# Patient Record
Sex: Male | Born: 1990 | Race: White | Hispanic: Yes | Marital: Married | Smoking: Former smoker
Health system: Southern US, Community
[De-identification: ages and names within clinical notes are randomized; demographics above are authoritative.]

---
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
## 2010-06-26 ENCOUNTER — Emergency Department: Payer: Self-pay | Admitting: Emergency Medicine

## 2015-10-11 DIAGNOSIS — M531 Cervicobrachial syndrome: Secondary | ICD-10-CM | POA: Diagnosis not present

## 2015-10-11 DIAGNOSIS — M9903 Segmental and somatic dysfunction of lumbar region: Secondary | ICD-10-CM | POA: Diagnosis not present

## 2015-10-27 DIAGNOSIS — L441 Lichen nitidus: Secondary | ICD-10-CM | POA: Diagnosis not present

## 2015-10-27 DIAGNOSIS — L309 Dermatitis, unspecified: Secondary | ICD-10-CM | POA: Diagnosis not present

## 2016-08-14 DIAGNOSIS — B86 Scabies: Secondary | ICD-10-CM | POA: Diagnosis not present

## 2016-08-15 ENCOUNTER — Ambulatory Visit (INDEPENDENT_AMBULATORY_CARE_PROVIDER_SITE_OTHER): Payer: BLUE CROSS/BLUE SHIELD | Admitting: Physician Assistant

## 2016-08-15 ENCOUNTER — Encounter: Payer: Self-pay | Admitting: Physician Assistant

## 2016-08-15 VITALS — BP 122/74 | HR 63 | Temp 98.0°F | Resp 18 | Ht 68.0 in | Wt 139.4 lb

## 2016-08-15 DIAGNOSIS — B001 Herpesviral vesicular dermatitis: Secondary | ICD-10-CM

## 2016-08-15 DIAGNOSIS — Z23 Encounter for immunization: Secondary | ICD-10-CM

## 2016-08-15 NOTE — Patient Instructions (Signed)
     IF you received an x-ray today, you will receive an invoice from [facility]. Please contact [facility] at [phone] with questions or concerns regarding your invoice.   IF you received labwork today, you will receive an invoice from [facility]. Please contact [facility] at [phone] with questions or concerns regarding your invoice.   Our billing staff will not be able to assist you with questions regarding bills from these companies.  You will be contacted with the lab results as soon as they are available. The fastest way to get your results is to activate your My Chart account. Instructions are located on the last page of this paperwork. If you have not heard from us regarding the results in 2 weeks, please contact this office.     

## 2016-08-16 LAB — HSV(HERPES SIMPLEX VRS) I + II AB-IGG
HSV 1 GLYCOPROTEIN G AB, IGG: 56.1 {index} — AB (ref 0.00–0.90)
HSV 2 Glycoprotein G Ab, IgG: <0.91 index (ref 0.00–0.90)

## 2016-08-18 NOTE — Progress Notes (Signed)
PRIMARY CARE AT [facility] [address] [place] [address] [phone]  Date:  08/15/2016   Name:  Christopher [name]   DOB:  [birth year]   MRN:  [id number]  PCP:  Patient, No Pcp Per    History of Present Illness:  Christopher [name] is a 26 y.o. male patient who presents to PCP with  Chief Complaint  Patient presents with  . HPV    has HPV already and wants to discuss possible transfer to girlfriend.   . penis issues    states he has an Herpes sore on his penis and wants it checked out.      Patient is here for concern of HPV diagnosis as he is planning to become sexually active with his girlfriend. He reports that he [facility] to remove HPV lesions from his genital area.  This was done by cryotherapy.  He notes that he was also given imoquid.  He had other testing performed such as HIV and syphilis, but is awaiting results. He is also aware that he has herpes.  He has never had genital lesions, but reports cold sores as a kid.  He has never been diagnosed with genital herpes, or HSV 2    There are no active problems to display for this patient.   No past medical history on file.  No past surgical history on file.  Social History  Substance Use Topics  . Smoking status: Never Smoker  . Smokeless tobacco: Never Used  . Alcohol use Yes    No family history on file.  No Known Allergies  Medication list has been reviewed and updated.  No current outpatient prescriptions on file prior to visit.   No current facility-administered medications on file prior to visit.     ROS ROS otherwise unremarkable unless listed above.  Physical Examination: BP 122/74   Pulse 63   Temp 98 F (36.7 C) (Oral)   Resp 18   Ht 5\' 8"  (1.727 m)   Wt 139 lb 6.4 oz (63.2 kg)   SpO2 99%   BMI 21.20 kg/m  Ideal Body Weight: Weight in (lb) to have BMI = 25: 164.1  Physical Exam  Constitutional: He is oriented to person, place, and time. He appears  well-developed and well-nourished. No distress.  HENT:  Head: Normocephalic and atraumatic.  Eyes: Conjunctivae and EOM are normal. Pupils are equal, round, and reactive to light.  Cardiovascular: Normal rate and regular rhythm.  Exam reveals no friction rub.   No murmur heard. Pulmonary/Chest: Effort normal.  Genitourinary:  Genitourinary Comments: Scabbed lesions that appear consistent with condyloma acuminata.  One small ulcerated lesion with slight white necrosis at the center.  Non-tender to the touch.  Neurological: He is alert and oriented to person, place, and time.  Skin: Skin is warm and dry. He is not diaphoretic.  Psychiatric: He has a normal mood and affect. His behavior is normal.     Assessment and Plan: Christopher [name] is a 26 y.o. male who is here today for cc of HPV and counseling  20 minutes of direct patient care of sexual activity and diagnosis of HPV.  He will obtain the gardasil today.   Return for 2nd in 2 months, final 6 months from today 1. Need for HPV vaccine - HPV 9-valent vaccine,Recombinat  2. Cold sore - HSV(herpes simplex vrs) 1+2 ab-IgG   Trena Platt, PA-C [facility] 08/18/2016  6:13 PM

## 2016-09-13 DIAGNOSIS — B078 Other viral warts: Secondary | ICD-10-CM | POA: Diagnosis not present

## 2016-09-13 DIAGNOSIS — S70361A Insect bite (nonvenomous), right thigh, initial encounter: Secondary | ICD-10-CM | POA: Diagnosis not present

## 2016-10-17 DIAGNOSIS — Z0001 Encounter for general adult medical examination with abnormal findings: Secondary | ICD-10-CM | POA: Diagnosis not present

## 2016-10-17 DIAGNOSIS — F1290 Cannabis use, unspecified, uncomplicated: Secondary | ICD-10-CM | POA: Diagnosis not present

## 2016-10-17 DIAGNOSIS — R4182 Altered mental status, unspecified: Secondary | ICD-10-CM | POA: Diagnosis not present

## 2016-10-17 DIAGNOSIS — Z111 Encounter for screening for respiratory tuberculosis: Secondary | ICD-10-CM | POA: Diagnosis not present

## 2016-10-18 ENCOUNTER — Ambulatory Visit (INDEPENDENT_AMBULATORY_CARE_PROVIDER_SITE_OTHER): Payer: BLUE CROSS/BLUE SHIELD | Admitting: Family Medicine

## 2016-10-18 DIAGNOSIS — Z23 Encounter for immunization: Secondary | ICD-10-CM

## 2016-10-18 NOTE — Progress Notes (Signed)
Pt was here for 2nd Gardisal shot

## 2016-12-20 DIAGNOSIS — A630 Anogenital (venereal) warts: Secondary | ICD-10-CM | POA: Diagnosis not present

## 2017-02-21 ENCOUNTER — Ambulatory Visit (INDEPENDENT_AMBULATORY_CARE_PROVIDER_SITE_OTHER): Payer: BLUE CROSS/BLUE SHIELD | Admitting: Physician Assistant

## 2017-02-21 DIAGNOSIS — Z23 Encounter for immunization: Secondary | ICD-10-CM | POA: Diagnosis not present

## 2017-02-21 DIAGNOSIS — IMO0001 Reserved for inherently not codable concepts without codable children: Secondary | ICD-10-CM

## 2017-02-27 DIAGNOSIS — N390 Urinary tract infection, site not specified: Secondary | ICD-10-CM | POA: Diagnosis not present

## 2017-02-27 DIAGNOSIS — R319 Hematuria, unspecified: Secondary | ICD-10-CM | POA: Diagnosis not present

## 2017-03-12 ENCOUNTER — Ambulatory Visit: Payer: BLUE CROSS/BLUE SHIELD

## 2017-03-12 DIAGNOSIS — R319 Hematuria, unspecified: Secondary | ICD-10-CM | POA: Diagnosis not present

## 2017-03-15 ENCOUNTER — Other Ambulatory Visit: Payer: Self-pay | Admitting: Nurse Practitioner

## 2017-03-21 ENCOUNTER — Telehealth: Payer: Self-pay

## 2017-03-21 NOTE — Telephone Encounter (Signed)
Pt called asking for ultrasound results.  dbs

## 2017-03-30 ENCOUNTER — Ambulatory Visit: Payer: Self-pay | Admitting: Nurse Practitioner

## 2017-04-02 ENCOUNTER — Other Ambulatory Visit (INDEPENDENT_AMBULATORY_CARE_PROVIDER_SITE_OTHER): Payer: BLUE CROSS/BLUE SHIELD

## 2017-04-02 DIAGNOSIS — R319 Hematuria, unspecified: Secondary | ICD-10-CM | POA: Diagnosis not present

## 2017-04-04 ENCOUNTER — Other Ambulatory Visit: Payer: Self-pay | Admitting: Nurse Practitioner

## 2017-04-04 DIAGNOSIS — R319 Hematuria, unspecified: Secondary | ICD-10-CM

## 2017-04-04 NOTE — Progress Notes (Signed)
Bladder u/s due to hematuria

## 2017-04-13 ENCOUNTER — Other Ambulatory Visit: Payer: Self-pay | Admitting: Nurse Practitioner

## 2017-04-13 DIAGNOSIS — R319 Hematuria, unspecified: Secondary | ICD-10-CM

## 2017-04-20 ENCOUNTER — Telehealth: Payer: Self-pay

## 2017-04-20 NOTE — Telephone Encounter (Signed)
Pt advised for u/s is normal

## 2017-06-21 DIAGNOSIS — M546 Pain in thoracic spine: Secondary | ICD-10-CM | POA: Diagnosis not present

## 2017-06-21 DIAGNOSIS — M9902 Segmental and somatic dysfunction of thoracic region: Secondary | ICD-10-CM | POA: Diagnosis not present

## 2017-06-21 DIAGNOSIS — M62830 Muscle spasm of back: Secondary | ICD-10-CM | POA: Diagnosis not present

## 2017-06-21 DIAGNOSIS — M6080 Other myositis, unspecified site: Secondary | ICD-10-CM | POA: Diagnosis not present

## 2017-06-27 ENCOUNTER — Encounter: Payer: Self-pay | Admitting: Physician Assistant

## 2017-06-28 ENCOUNTER — Encounter: Payer: Self-pay | Admitting: Nurse Practitioner

## 2017-06-28 ENCOUNTER — Ambulatory Visit: Payer: BLUE CROSS/BLUE SHIELD | Admitting: Nurse Practitioner

## 2017-06-28 VITALS — BP 126/77 | HR 90 | Resp 16 | Ht 67.0 in | Wt 141.0 lb

## 2017-06-28 DIAGNOSIS — S30862A Insect bite (nonvenomous) of penis, initial encounter: Secondary | ICD-10-CM | POA: Diagnosis not present

## 2017-06-28 DIAGNOSIS — W57XXXA Bitten or stung by nonvenomous insect and other nonvenomous arthropods, initial encounter: Secondary | ICD-10-CM

## 2017-06-28 DIAGNOSIS — L209 Atopic dermatitis, unspecified: Secondary | ICD-10-CM | POA: Diagnosis not present

## 2017-06-28 MED ORDER — DOXYCYCLINE HYCLATE 100 MG PO TABS: 100.0000 mg | ORAL_TABLET | Freq: Two times a day (BID) | ORAL | 0 refills | Status: DC

## 2017-06-28 NOTE — Progress Notes (Signed)
[facility] [address] [place], [place] [address]  Internal MEDICINE  Office Visit Note  Patient Name: Christopher [name]  [id number]  [id number]  Date of Service: 07/22/2017   Pt is here for a sick visit.  Chief Complaint  Patient presents with  . Insect Bite    possible spider bite at the genital area. notice last week no fever, then broke out in a rash on lower abdominal area. not itching since the first day noticed it.      The patient has noted a sore and itchy red spot on the top aspect of his penis. Also present is rash in ggroin area and along the waist band of his boxer shorts. He states that he recently was seen by health department and was tested for sexually transmitted infections. Testing was all negative. He is sexually active with the same woman.        Current Medication:  Outpatient Encounter Medications as of 06/28/2017  Medication Sig  . doxycycline (VIBRA-TABS) 100 MG tablet Take 1 tablet (100 mg total) by mouth 2 (two) times daily.  . imiquimod (ALDARA) 5 % cream Apply topically 3 (three) times a week.   No facility-administered encounter medications on file as of 06/28/2017.       Medical History: No past medical history on file.   Vital Signs: BP 126/77 (BP Location: Right Arm, Patient Position: Sitting, Cuff Size: Normal)   Pulse 90   Resp 16   Ht 5\' 7"  (1.702 m)   Wt 141 lb (64 kg)   SpO2 100%   BMI 22.08 kg/m    Review of Systems  Constitutional: Negative for activity change, chills, fatigue and unexpected weight change.  HENT: Negative for congestion, postnasal drip, rhinorrhea, sneezing and sore throat.   Eyes: Negative.  Negative for redness.  Respiratory: Negative for cough, chest tightness, shortness of breath and wheezing.   Cardiovascular: Negative for chest pain and palpitations.  Gastrointestinal: Negative for abdominal pain, constipation, diarrhea, nausea and vomiting.  Endocrine: Negative for cold intolerance,  heat intolerance, polydipsia, polyphagia and polyuria.  Genitourinary: Negative for discharge, dysuria, frequency, penile pain, penile swelling, testicular pain and urgency.       Red, itchy spot on the anterior aspect of his penis. Skin intact and there is no drainage that he has noted.   Musculoskeletal: Negative for arthralgias, back pain, joint swelling and neck pain.  Skin: Positive for rash.       Both groin and along the waist band of his boxer shorts.   Allergic/Immunologic: Negative for environmental allergies.  Neurological: Negative for tremors, numbness and headaches.  Hematological: Negative for adenopathy. Does not bruise/bleed easily.  Psychiatric/Behavioral: Negative for behavioral problems (Depression), dysphoric mood, sleep disturbance and suicidal ideas. The patient is not nervous/anxious.     Physical Exam  Constitutional: He is oriented to person, place, and time. He appears well-developed and well-nourished. No distress.  HENT:  Head: Normocephalic and atraumatic.  Mouth/Throat: Oropharynx is clear and moist. No oropharyngeal exudate.  Eyes: Pupils are equal, round, and reactive to light. EOM are normal.  Neck: Normal range of motion. Neck supple. No JVD present. No tracheal deviation present. No thyromegaly present.  Cardiovascular: Normal rate, regular rhythm and normal heart sounds. Exam reveals no gallop and no friction rub.  No murmur heard. Pulmonary/Chest: Effort normal and breath sounds normal. No respiratory distress. He has no wheezes. He has no rales. He exhibits no tenderness.  Abdominal: Soft. Bowel sounds are  normal. There is no tenderness.  Genitourinary: No penile tenderness.  Genitourinary Comments: There is small, red, warm lesion on the anterior aspect of the shaft of the penis. Mild swelling present. Skin intact and no drainage is present.   Musculoskeletal: Normal range of motion.  Lymphadenopathy:    He has no cervical adenopathy.  Neurological:  He is alert and oriented to person, place, and time. No cranial nerve deficit.  Skin: Skin is warm and dry. He is not diaphoretic.  Fine, red rash located in bilateral groin area and along the waistband of his pants/boxer shorts. Skin is intact with no drainage present.   Psychiatric: He has a normal mood and affect. His behavior is normal. Judgment and thought content normal.  Nursing note and vitals reviewed.   Assessment/Plan: 1. Insect bite (nonvenomous) of penis, initial encounter Unsure of type of insect bite present, but will treat with doxycycline 100mg  twice daily for 10 days to cover most infections associated with insect bites. Reassess as needed   2. Atopic dermatitis, unspecified type Recommend use of OTC clotrimazole cream to use in bilateral groin areas and along the waistband of the clothing. Use antibiotics as indicated.  - doxycycline (VIBRA-TABS) 100 MG tablet; Take 1 tablet (100 mg total) by mouth 2 (two) times daily.  Dispense: 20 tablet; Refill: 0  General Counseling: Jaloni verbalizes understanding of the findings of todays visit and agrees with plan of treatment. I have discussed any further diagnostic evaluation that may be needed or ordered today. We also reviewed his medications today. he has been encouraged to call the office with any questions or concerns that should arise related to todays visit.  This patient was seen by Vincent GrosHeather Admire Bunnell, FNP- C in Collaboration with Dr Lyndon CodeFozia M Khan as a part of collaborative care agreement    Meds ordered this encounter  Medications  . doxycycline (VIBRA-TABS) 100 MG tablet    Sig: Take 1 tablet (100 mg total) by mouth 2 (two) times daily.    Dispense:  20 tablet    Refill:  0    Order Specific Question:   Supervising Provider    Answer:   Lyndon CodeKHAN, FOZIA M [[id number]]    Time spent: 15 Minutes

## 2017-07-22 DIAGNOSIS — L209 Atopic dermatitis, unspecified: Secondary | ICD-10-CM | POA: Insufficient documentation

## 2017-07-22 DIAGNOSIS — W57XXXA Bitten or stung by nonvenomous insect and other nonvenomous arthropods, initial encounter: Secondary | ICD-10-CM | POA: Insufficient documentation

## 2017-07-22 DIAGNOSIS — S30862A Insect bite (nonvenomous) of penis, initial encounter: Secondary | ICD-10-CM | POA: Insufficient documentation

## 2018-05-16 ENCOUNTER — Ambulatory Visit
Admission: EM | Admit: 2018-05-16 | Discharge: 2018-05-16 | Disposition: A | Discharge status: Home / Self Care | Payer: Worker's Compensation

## 2018-05-16 NOTE — ED Triage Notes (Signed)
10:07am: patient successfully gave a urine specimen.
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
## 2018-08-01 DIAGNOSIS — F419 Anxiety disorder, unspecified: Secondary | ICD-10-CM | POA: Diagnosis not present

## 2018-08-01 DIAGNOSIS — F438 Other reactions to severe stress: Secondary | ICD-10-CM | POA: Diagnosis not present

## 2018-08-06 DIAGNOSIS — F438 Other reactions to severe stress: Secondary | ICD-10-CM | POA: Diagnosis not present

## 2018-08-06 DIAGNOSIS — F419 Anxiety disorder, unspecified: Secondary | ICD-10-CM | POA: Diagnosis not present

## 2018-08-12 DIAGNOSIS — F438 Other reactions to severe stress: Secondary | ICD-10-CM | POA: Diagnosis not present

## 2018-08-12 DIAGNOSIS — F419 Anxiety disorder, unspecified: Secondary | ICD-10-CM | POA: Diagnosis not present

## 2018-08-20 DIAGNOSIS — F438 Other reactions to severe stress: Secondary | ICD-10-CM | POA: Diagnosis not present

## 2018-08-20 DIAGNOSIS — F419 Anxiety disorder, unspecified: Secondary | ICD-10-CM | POA: Diagnosis not present

## 2018-08-27 DIAGNOSIS — F419 Anxiety disorder, unspecified: Secondary | ICD-10-CM | POA: Diagnosis not present

## 2018-08-27 DIAGNOSIS — F438 Other reactions to severe stress: Secondary | ICD-10-CM | POA: Diagnosis not present

## 2018-09-10 DIAGNOSIS — F419 Anxiety disorder, unspecified: Secondary | ICD-10-CM | POA: Diagnosis not present

## 2018-09-10 DIAGNOSIS — F438 Other reactions to severe stress: Secondary | ICD-10-CM | POA: Diagnosis not present

## 2018-09-19 DIAGNOSIS — F419 Anxiety disorder, unspecified: Secondary | ICD-10-CM | POA: Diagnosis not present

## 2018-09-19 DIAGNOSIS — F438 Other reactions to severe stress: Secondary | ICD-10-CM | POA: Diagnosis not present

## 2018-10-01 DIAGNOSIS — F419 Anxiety disorder, unspecified: Secondary | ICD-10-CM | POA: Diagnosis not present

## 2018-10-01 DIAGNOSIS — F438 Other reactions to severe stress: Secondary | ICD-10-CM | POA: Diagnosis not present

## 2018-10-07 DIAGNOSIS — F419 Anxiety disorder, unspecified: Secondary | ICD-10-CM | POA: Diagnosis not present

## 2018-10-07 DIAGNOSIS — F438 Other reactions to severe stress: Secondary | ICD-10-CM | POA: Diagnosis not present

## 2018-10-15 DIAGNOSIS — F419 Anxiety disorder, unspecified: Secondary | ICD-10-CM | POA: Diagnosis not present

## 2018-10-15 DIAGNOSIS — F438 Other reactions to severe stress: Secondary | ICD-10-CM | POA: Diagnosis not present

## 2018-10-16 DIAGNOSIS — Z1159 Encounter for screening for other viral diseases: Secondary | ICD-10-CM | POA: Diagnosis not present

## 2018-10-22 DIAGNOSIS — F419 Anxiety disorder, unspecified: Secondary | ICD-10-CM | POA: Diagnosis not present

## 2018-10-22 DIAGNOSIS — F438 Other reactions to severe stress: Secondary | ICD-10-CM | POA: Diagnosis not present

## 2018-10-28 DIAGNOSIS — F438 Other reactions to severe stress: Secondary | ICD-10-CM | POA: Diagnosis not present

## 2018-10-28 DIAGNOSIS — F419 Anxiety disorder, unspecified: Secondary | ICD-10-CM | POA: Diagnosis not present

## 2018-11-04 DIAGNOSIS — F438 Other reactions to severe stress: Secondary | ICD-10-CM | POA: Diagnosis not present

## 2018-11-04 DIAGNOSIS — F419 Anxiety disorder, unspecified: Secondary | ICD-10-CM | POA: Diagnosis not present

## 2018-11-11 DIAGNOSIS — F419 Anxiety disorder, unspecified: Secondary | ICD-10-CM | POA: Diagnosis not present

## 2018-11-11 DIAGNOSIS — F438 Other reactions to severe stress: Secondary | ICD-10-CM | POA: Diagnosis not present

## 2018-11-18 DIAGNOSIS — F419 Anxiety disorder, unspecified: Secondary | ICD-10-CM | POA: Diagnosis not present

## 2018-11-18 DIAGNOSIS — F438 Other reactions to severe stress: Secondary | ICD-10-CM | POA: Diagnosis not present

## 2018-11-25 DIAGNOSIS — F438 Other reactions to severe stress: Secondary | ICD-10-CM | POA: Diagnosis not present

## 2018-11-25 DIAGNOSIS — F419 Anxiety disorder, unspecified: Secondary | ICD-10-CM | POA: Diagnosis not present

## 2018-12-09 DIAGNOSIS — F438 Other reactions to severe stress: Secondary | ICD-10-CM | POA: Diagnosis not present

## 2018-12-09 DIAGNOSIS — F419 Anxiety disorder, unspecified: Secondary | ICD-10-CM | POA: Diagnosis not present

## 2018-12-16 DIAGNOSIS — F419 Anxiety disorder, unspecified: Secondary | ICD-10-CM | POA: Diagnosis not present

## 2018-12-16 DIAGNOSIS — F438 Other reactions to severe stress: Secondary | ICD-10-CM | POA: Diagnosis not present

## 2018-12-26 DIAGNOSIS — F419 Anxiety disorder, unspecified: Secondary | ICD-10-CM | POA: Diagnosis not present

## 2018-12-26 DIAGNOSIS — F438 Other reactions to severe stress: Secondary | ICD-10-CM | POA: Diagnosis not present

## 2019-02-06 DIAGNOSIS — F419 Anxiety disorder, unspecified: Secondary | ICD-10-CM | POA: Diagnosis not present

## 2019-02-06 DIAGNOSIS — F438 Other reactions to severe stress: Secondary | ICD-10-CM | POA: Diagnosis not present

## 2019-04-22 ENCOUNTER — Ambulatory Visit: Payer: BC Managed Care – PPO | Admitting: Family Medicine

## 2019-04-22 ENCOUNTER — Other Ambulatory Visit: Payer: Self-pay

## 2019-04-22 ENCOUNTER — Encounter: Payer: Self-pay | Admitting: Family Medicine

## 2019-04-22 ENCOUNTER — Ambulatory Visit (INDEPENDENT_AMBULATORY_CARE_PROVIDER_SITE_OTHER): Payer: BC Managed Care – PPO

## 2019-04-22 VITALS — BP 104/78 | HR 65 | Ht 67.0 in | Wt 138.0 lb

## 2019-04-22 DIAGNOSIS — M25562 Pain in left knee: Secondary | ICD-10-CM | POA: Diagnosis not present

## 2019-04-22 DIAGNOSIS — S134XXA Sprain of ligaments of cervical spine, initial encounter: Secondary | ICD-10-CM | POA: Insufficient documentation

## 2019-04-22 DIAGNOSIS — M999 Biomechanical lesion, unspecified: Secondary | ICD-10-CM | POA: Diagnosis not present

## 2019-04-22 MED ORDER — TIZANIDINE HCL 4 MG PO TABS: 4.0000 mg | ORAL_TABLET | Freq: Every day | ORAL | 0 refills | Status: DC

## 2019-04-22 MED ORDER — MELOXICAM 15 MG PO TABS: 15.0000 mg | ORAL_TABLET | Freq: Every day | ORAL | 0 refills | Status: DC
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
## 2019-04-22 NOTE — Assessment & Plan Note (Signed)
Decision today to treat with OMT was based on Physical Exam  After verbal consent patient was treated with HVLA, ME, FPR techniques in cervical, thoracic, rib,  lumbar and sacral areas  Patient tolerated the procedure well with improvement in symptoms  Patient given exercises, stretches and lifestyle modifications  See medications in patient instructions if given  Patient will follow up in 4-8 weeks 

## 2019-04-22 NOTE — Patient Instructions (Signed)
Exercises 3x a week Ice 20 min 2x a day Zanaflex and Mobic called into pharmacy See me again in 3-4 weeks
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
## 2019-04-22 NOTE — Assessment & Plan Note (Signed)
Left lateral knee pain.  New problem.  Appear to have a very small hematoma noted on the superior lateral aspect of the patella.  This is likely a hematoma from an injury.  Possible partial subluxation.  Patient has good strength.  Patient likely will improve but given range of motion exercises.  No significant instability of the knee noted.  Discussed icing regimen and home exercises.  Worsening symptoms will get x-rays as well as consider formal physical therapy and bracing.

## 2019-05-22 ENCOUNTER — Other Ambulatory Visit: Payer: Self-pay

## 2019-05-22 ENCOUNTER — Ambulatory Visit: Payer: BC Managed Care – PPO | Admitting: Family Medicine

## 2019-05-22 ENCOUNTER — Other Ambulatory Visit (INDEPENDENT_AMBULATORY_CARE_PROVIDER_SITE_OTHER): Payer: BC Managed Care – PPO

## 2019-05-22 VITALS — BP 98/66 | HR 55 | Ht 67.0 in | Wt 139.0 lb

## 2019-05-22 DIAGNOSIS — M999 Biomechanical lesion, unspecified: Secondary | ICD-10-CM

## 2019-05-22 DIAGNOSIS — M24551 Contracture, right hip: Secondary | ICD-10-CM | POA: Diagnosis not present

## 2019-05-22 DIAGNOSIS — M2550 Pain in unspecified joint: Secondary | ICD-10-CM

## 2019-05-22 LAB — CBC WITH DIFFERENTIAL/PLATELET
Basophils Absolute: 0 10*3/uL (ref 0.0–0.1)
Basophils Relative: 0.4 % (ref 0.0–3.0)
Eosinophils Absolute: 0.1 10*3/uL (ref 0.0–0.7)
Eosinophils Relative: 1 % (ref 0.0–5.0)
HCT: 39.6 % (ref 39.0–52.0)
Hemoglobin: 13.4 g/dL (ref 13.0–17.0)
Lymphocytes Relative: 37 % (ref 12.0–46.0)
Lymphs Abs: 2.2 10*3/uL (ref 0.7–4.0)
MCHC: 33.8 g/dL (ref 30.0–36.0)
MCV: 90 fl (ref 78.0–100.0)
Monocytes Absolute: 0.4 10*3/uL (ref 0.1–1.0)
Monocytes Relative: 7.2 % (ref 3.0–12.0)
Neutro Abs: 3.3 10*3/uL (ref 1.4–7.7)
Neutrophils Relative %: 54.4 % (ref 43.0–77.0)
Platelets: 243 10*3/uL (ref 150.0–400.0)
RBC: 4.4 Mil/uL (ref 4.22–5.81)
RDW: 12.5 % (ref 11.5–15.5)
WBC: 6 10*3/uL (ref 4.0–10.5)

## 2019-05-22 LAB — COMPREHENSIVE METABOLIC PANEL
ALT: 21 U/L (ref 0–53)
AST: 28 U/L (ref 0–37)
Albumin: 4.5 g/dL (ref 3.5–5.2)
Alkaline Phosphatase: 41 U/L (ref 39–117)
BUN: 16 mg/dL (ref 6–23)
CO2: 32 mEq/L (ref 19–32)
Calcium: 9.4 mg/dL (ref 8.4–10.5)
Chloride: 102 mEq/L (ref 96–112)
Creatinine, Ser: 0.89 mg/dL (ref 0.40–1.50)
GFR: 101.39 mL/min (ref >60.00)
Glucose, Bld: 80 mg/dL (ref 70–99)
Potassium: 3.6 mEq/L (ref 3.5–5.1)
Sodium: 139 mEq/L (ref 135–145)
Total Bilirubin: 0.8 mg/dL (ref 0.2–1.2)
Total Protein: 7.4 g/dL (ref 6.0–8.3)

## 2019-05-22 LAB — IBC PANEL
Iron: 126 ug/dL (ref 42–165)
Saturation Ratios: 36.9 % (ref 20.0–50.0)
Transferrin: 244 mg/dL (ref 212.0–360.0)

## 2019-05-22 LAB — C-REACTIVE PROTEIN: CRP: <1 mg/dL (ref 0.5–20.0)

## 2019-05-22 LAB — URIC ACID: Uric Acid, Serum: 4.5 mg/dL (ref 4.0–7.8)

## 2019-05-22 LAB — SEDIMENTATION RATE: Sed Rate: 6 mm/hr (ref 0–15)

## 2019-05-22 LAB — VITAMIN D 25 HYDROXY (VIT D DEFICIENCY, FRACTURES): VITD: 18.87 ng/mL — ABNORMAL LOW (ref 30.00–100.00)

## 2019-05-22 LAB — TSH: TSH: 1.32 u[IU]/mL (ref 0.35–4.50)

## 2019-05-22 LAB — FERRITIN: Ferritin: 69.8 ng/mL (ref 22.0–322.0)

## 2019-05-22 LAB — TESTOSTERONE: Testosterone: 269.01 ng/dL — ABNORMAL LOW (ref 300.00–890.00)
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
## 2019-05-22 NOTE — Patient Instructions (Signed)
Coban with lifting Labs today  See me again in 4-5 weeks

## 2019-05-23 ENCOUNTER — Encounter: Payer: Self-pay | Admitting: Family Medicine

## 2019-05-23 DIAGNOSIS — M2550 Pain in unspecified joint: Secondary | ICD-10-CM | POA: Insufficient documentation

## 2019-05-23 DIAGNOSIS — M24551 Contracture, right hip: Secondary | ICD-10-CM | POA: Insufficient documentation

## 2019-05-23 LAB — CALCIUM, IONIZED: Calcium, Ion: 5 mg/dL (ref 4.8–5.6)

## 2019-05-23 LAB — PTH, INTACT AND CALCIUM
Calcium: 9.4 mg/dL (ref 8.6–10.3)
PTH: 22 pg/mL (ref 14–64)

## 2019-05-23 LAB — ANGIOTENSIN CONVERTING ENZYME: Angiotensin-Converting Enzyme: 25 U/L (ref 9–67)

## 2019-05-23 LAB — ANA: Anti Nuclear Antibody (ANA): NEGATIVE

## 2019-05-23 LAB — RHEUMATOID FACTOR: Rheumatoid fact SerPl-aCnc: <14 IU/mL (ref <14)

## 2019-05-23 LAB — CYCLIC CITRUL PEPTIDE ANTIBODY, IGG: Cyclic Citrullin Peptide Ab: <16 UNITS

## 2019-05-23 NOTE — Assessment & Plan Note (Signed)
Patient has more aches and pains and what should be anticipated for somebody this age in activity level.  That would like further work-up to rule out any autoimmune disease that could be contributing, we will also rule out testosterone, vitamin D, iron could also be contributing.  Follow-up after laboratory work-up and discuss further treatment options.
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
## 2019-05-23 NOTE — Assessment & Plan Note (Signed)
Decision today to treat with OMT was based on Physical Exam  After verbal consent patient was treated with HVLA, ME, FPR techniques in cervical, thoracic, rib,  lumbar and sacral areas  Patient tolerated the procedure well with improvement in symptoms  Patient given exercises, stretches and lifestyle modifications  See medications in patient instructions if given  Patient will follow up in 4-8 weeks 

## 2019-05-28 DIAGNOSIS — L700 Acne vulgaris: Secondary | ICD-10-CM | POA: Diagnosis not present

## 2019-05-28 DIAGNOSIS — L603 Nail dystrophy: Secondary | ICD-10-CM | POA: Diagnosis not present

## 2019-06-24 ENCOUNTER — Ambulatory Visit (INDEPENDENT_AMBULATORY_CARE_PROVIDER_SITE_OTHER): Payer: BC Managed Care – PPO | Admitting: Family Medicine

## 2019-06-24 ENCOUNTER — Other Ambulatory Visit: Payer: Self-pay

## 2019-06-24 ENCOUNTER — Encounter: Payer: Self-pay | Admitting: Family Medicine

## 2019-06-24 VITALS — BP 100/70 | HR 61 | Ht 67.0 in | Wt 147.0 lb

## 2019-06-24 DIAGNOSIS — M999 Biomechanical lesion, unspecified: Secondary | ICD-10-CM | POA: Diagnosis not present

## 2019-06-24 DIAGNOSIS — M24551 Contracture, right hip: Secondary | ICD-10-CM

## 2019-06-24 DIAGNOSIS — M25512 Pain in left shoulder: Secondary | ICD-10-CM | POA: Diagnosis not present

## 2019-06-24 DIAGNOSIS — G8929 Other chronic pain: Secondary | ICD-10-CM

## 2019-06-24 MED ORDER — VITAMIN D (ERGOCALCIFEROL) 1.25 MG (50000 UNIT) PO CAPS: 50000.0000 [IU] | ORAL_CAPSULE | ORAL | 0 refills | Status: DC

## 2019-06-24 MED ORDER — MELOXICAM 7.5 MG PO TABS: 7.5000 mg | ORAL_TABLET | Freq: Every day | ORAL | 0 refills | Status: DC

## 2019-06-24 NOTE — Assessment & Plan Note (Signed)

## 2019-06-24 NOTE — Assessment & Plan Note (Signed)
Chronic problem : Chronic problem seems stable.  Multiple problems though noted today.  interventions previously, including medication management: Discussed over-the-counter medications, patient has responded well to conservative therapy   Interventions this visit: Osteopathic manipulation We discussed with patient the importance ergonomics, home exercises, icing regimen, and over-the-counter natural products.   Future considerations but will be based on evaluation and next visit: Formal physical therapy if needed    Return to clinic: 4 to 12 weeks

## 2019-06-24 NOTE — Progress Notes (Signed)
[facility] [address] [place] Phone: [phone] Subjective:   I Christopher [name] am serving as a Education administrator for Dr. Hulan [name].  This visit occurred during the SARS-CoV-2 public health emergency.  Safety protocols were in place, including screening questions prior to the visit, additional usage of staff PPE, and extensive cleaning of exam room while observing appropriate contact time as indicated for disinfecting solutions.   I'm seeing this patient by the request  of:  Patient, No Pcp Per  CC: Low back pain follow-up  IEP:[id number]  Christopher [name] is a 29 y.o. male coming in with complaint of back pain. Last seen 05/22/2019 for OMT. Shoulder is still tender. Has been resting the shoulder due to pain. Lower back is still painful.  Patient has not been working out on a regular basis.  Knows that he feels better when he is doing it.  Patient states that the left shoulder has been more discomfort recently.  Has had laboratory work-up for more of a polyarthralgia.  Was found to have some mild decrease in testosterone and vitamin D.  Has not been supplementing at this moment.     No past medical history on file. No past surgical history on file. Social History   Socioeconomic History  . Marital status: Single    Spouse name: Not on file  . Number of children: Not on file  . Years of education: Not on file  . Highest education level: Not on file  Occupational History  . Not on file  Tobacco Use  . Smoking status: Never Smoker  . Smokeless tobacco: Never Used  Substance and Sexual Activity  . Alcohol use: Yes    Comment: rarely  . Drug use: Yes    Types: Marijuana    Comment: occasionnaly  . Sexual activity: Not on file  Other Topics Concern  . Not on file  Social History Narrative  . Not on file   Social Determinants of Health   Financial Resource Strain:   . Difficulty of Paying Living Expenses:   Food Insecurity:   .  Worried About Charity fundraiser in the Last Year:   . Arboriculturist in the Last Year:   Transportation Needs:   . Film/video editor (Medical):   Marland Kitchen Lack of Transportation (Non-Medical):   Physical Activity:   . Days of Exercise per Week:   . Minutes of Exercise per Session:   Stress:   . Feeling of Stress :   Social Connections:   . Frequency of Communication with Friends and Family:   . Frequency of Social Gatherings with Friends and Family:   . Attends Religious Services:   . Active Member of Clubs or Organizations:   . Attends Archivist Meetings:   Marland Kitchen Marital Status:    No Known Allergies No family history on file.       Current Outpatient Medications (Other):  .  doxycycline (VIBRA-TABS) 100 MG tablet, Take 1 tablet (100 mg total) by mouth 2 (two) times daily. .  imiquimod (ALDARA) 5 % cream, Apply topically 3 (three) times a week.   Reviewed prior external information including notes and imaging from  primary care provider As well as notes that were available from care everywhere and other healthcare systems.  Past medical history, social, surgical and family history all reviewed in electronic medical record.  No pertanent information unless stated regarding to the chief complaint.   Review  of Systems:  No headache, visual changes, nausea, vomiting, diarrhea, constipation, dizziness, abdominal pain, skin rash, fevers, chills, night sweats, weight loss, swollen lymph nodes, body aches, joint swelling, chest pain, shortness of breath, mood changes. POSITIVE muscle aches  Objective  There were no vitals taken for this visit.   General: No apparent distress alert and oriented x3 mood and affect normal, dressed appropriately.  HEENT: Pupils equal, extraocular movements intact  Respiratory: Patient's speak in full sentences and does not appear short of breath  Cardiovascular: No lower extremity edema, non tender, no erythema  Neuro: Cranial nerves II  through XII are intact, neurovascularly intact in all extremities with 2+ DTRs and 2+ pulses.  Gait normal with good balance and coordination.  MSK:  Non tender with full range of motion and good stability and symmetric strength and tone of shoulders, elbows, wrist, hip, knee and ankles bilaterally.  Low back exam does have significant tightness still noted of the hip flexors.  Patient does have some mild tightness of the FABER test bilaterally.  Osteopathic findings  C2 flexed rotated and side bent right C6 flexed rotated and side bent left T3 extended rotated and side bent right inhaled third rib T6 extended rotated and side bent left L2 flexed rotated and side bent right Sacrum right on right    Impression and Recommendations:     This case required medical decision making of moderate complexity. The above documentation has been reviewed and is accurate and complete Christopher Saa, DO       Note: This dictation was prepared with Dragon dictation along with smaller phrase technology. Any transcriptional errors that result from this process are unintentional.

## 2019-06-24 NOTE — Patient Instructions (Addendum)
Good to see you Once weekly vitamin D Meloxicam daily for 10 days then as needed Exercises as a cool down DHEA 50 mg daily for 4 weeks on amazon See me again in 4-5 weeks if not perfect we will inject shoulder

## 2019-06-24 NOTE — Assessment & Plan Note (Signed)
Likely more secondary to scapula.  Discussed icing regimen and home exercises.  And questionable labral pathology.  Worsening pain will consider injection but I am optimistic patient should do well with the manipulation.

## 2019-07-25 ENCOUNTER — Ambulatory Visit (INDEPENDENT_AMBULATORY_CARE_PROVIDER_SITE_OTHER): Payer: BC Managed Care – PPO | Admitting: Family Medicine

## 2019-07-25 ENCOUNTER — Encounter: Payer: Self-pay | Admitting: Family Medicine

## 2019-07-25 ENCOUNTER — Other Ambulatory Visit: Payer: Self-pay

## 2019-07-25 VITALS — BP 100/60 | HR 80 | Ht 67.0 in | Wt 151.0 lb

## 2019-07-25 DIAGNOSIS — M999 Biomechanical lesion, unspecified: Secondary | ICD-10-CM | POA: Diagnosis not present

## 2019-07-25 DIAGNOSIS — G8929 Other chronic pain: Secondary | ICD-10-CM

## 2019-07-25 DIAGNOSIS — M25512 Pain in left shoulder: Secondary | ICD-10-CM

## 2019-07-25 NOTE — Patient Instructions (Signed)
Overall not bad Massage might be good Have fun! See you in 8 weeks

## 2019-07-25 NOTE — Progress Notes (Signed)
[facility] [address] [place] Phone: [phone] Subjective:   I [name] am serving as a Education administrator for Dr. Hulan Saas.  This visit occurred during the SARS-CoV-2 public health emergency.  Safety protocols were in place, including screening questions prior to the visit, additional usage of staff PPE, and extensive cleaning of exam room while observing appropriate contact time as indicated for disinfecting solutions.   I'm seeing this patient by the request  of:  Patient, No Pcp Per Back and shoulder pain  CC:   [id number]  CHRISTION LEONHARD is a 29 y.o. male coming in with complaint of back and shoulder pain pain. Last seen on 06/24/2019 for OMT. Patient states shoulder is a little TTP due to recent shoulder workout. Lower back painful. Back pops with situps. Overall improving. Still trying to stay active.        No past medical history on file. No past surgical history on file. Social History   Socioeconomic History  . Marital status: Single    Spouse name: Not on file  . Number of children: Not on file  . Years of education: Not on file  . Highest education level: Not on file  Occupational History  . Not on file  Tobacco Use  . Smoking status: Never Smoker  . Smokeless tobacco: Never Used  Substance and Sexual Activity  . Alcohol use: Yes    Comment: rarely  . Drug use: Yes    Types: Marijuana    Comment: occasionnaly  . Sexual activity: Not on file  Other Topics Concern  . Not on file  Social History Narrative  . Not on file   Social Determinants of Health   Financial Resource Strain:   . Difficulty of Paying Living Expenses:   Food Insecurity:   . Worried About Charity fundraiser in the Last Year:   . Arboriculturist in the Last Year:   Transportation Needs:   . Film/video editor (Medical):   Marland Kitchen Lack of Transportation (Non-Medical):   Physical Activity:   . Days of Exercise per Week:   .  Minutes of Exercise per Session:   Stress:   . Feeling of Stress :   Social Connections:   . Frequency of Communication with Friends and Family:   . Frequency of Social Gatherings with Friends and Family:   . Attends Religious Services:   . Active Member of Clubs or Organizations:   . Attends Archivist Meetings:   Marland Kitchen Marital Status:    No Known Allergies No family history on file.     Current Outpatient Medications (Analgesics):  .  meloxicam (MOBIC) 7.5 MG tablet, Take 1 tablet (7.5 mg total) by mouth daily.   Current Outpatient Medications (Other):  .  doxycycline (VIBRA-TABS) 100 MG tablet, Take 1 tablet (100 mg total) by mouth 2 (two) times daily. .  imiquimod (ALDARA) 5 % cream, Apply topically 3 (three) times a week. .  Vitamin D, Ergocalciferol, (DRISDOL) 1.25 MG (50000 UNIT) CAPS capsule, Take 1 capsule (50,000 Units total) by mouth every 7 (seven) days.   Reviewed prior external information including notes and imaging from  primary care provider As well as notes that were available from care everywhere and other healthcare systems.  Past medical history, social, surgical and family history all reviewed in electronic medical record.  No pertanent information unless stated regarding to the chief complaint.   Review of Systems:  No headache, visual changes, nausea, vomiting, diarrhea, constipation, dizziness, abdominal pain, skin rash, fevers, chills, night sweats, weight loss, swollen lymph nodes, body aches, joint swelling, chest pain, shortness of breath, mood changes. POSITIVE muscle aches  Objective  Blood pressure 100/60, pulse 80, height 5\' 7"  (1.702 m), weight 151 lb (68.5 kg), SpO2 98 %.   General: No apparent distress alert and oriented x3 mood and affect normal, dressed appropriately.  HEENT: Pupils equal, extraocular movements intact  Respiratory: Patient's speak in full sentences and does not appear short of breath  Cardiovascular: No lower  extremity edema, non tender, no erythema  Neuro: Cranial nerves II through XII are intact, neurovascularly intact in all extremities with 2+ DTRs and 2+ pulses.  Gait normal with good balance and coordination.  MSK:  Non tender with full range of motion and good stability and symmetric strength and tone of shoulders, elbows, wrist, hip, knee and ankles bilaterally.  TTP in mid scapula region. Shoulder on left side mild impingement.   Osteopathic findings C2 flexed rotated and side bent right C4 flexed rotated and side bent left C6 flexed rotated and side bent left T3 extended rotated and side bent left inhaled third rib T9 extended rotated and side bent right  L2 flexed rotated and side bent right Sacrum right on right     Impression and Recommendations:     This case required medical decision making of moderate complexity. The above documentation has been reviewed and is accurate and complete , DO       Note: This dictation was prepared with Dragon dictation along with smaller phrase technology. Any transcriptional errors that result from this process are unintentional.

## 2019-07-26 ENCOUNTER — Encounter: Payer: Self-pay | Admitting: Family Medicine

## 2019-07-26 NOTE — Assessment & Plan Note (Signed)
Overall improvement noted.  Discuss scapula strengthening   Respond well to OMT  RTC in 3 months

## 2019-07-26 NOTE — Assessment & Plan Note (Signed)
Decision today to treat with OMT was based on Physical Exam  After verbal consent patient was treated with HVLA techniques in cervical, thoracic, rib, lumbar and sacral  areas  Patient tolerated the procedure well with improvement in symptoms  Patient given exercises, stretches and lifestyle modifications  See medications in patient instructions if given  Patient will follow up in 12 weeks

## 2019-08-05 ENCOUNTER — Ambulatory Visit: Payer: BC Managed Care – PPO | Admitting: Family Medicine

## 2019-08-05 ENCOUNTER — Encounter: Payer: Self-pay | Admitting: Family Medicine

## 2019-08-05 ENCOUNTER — Other Ambulatory Visit: Payer: Self-pay

## 2019-08-05 DIAGNOSIS — Z113 Encounter for screening for infections with a predominantly sexual mode of transmission: Secondary | ICD-10-CM

## 2019-08-05 DIAGNOSIS — B977 Papillomavirus as the cause of diseases classified elsewhere: Secondary | ICD-10-CM
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
## 2019-09-04 ENCOUNTER — Ambulatory Visit: Payer: Self-pay | Admitting: Dermatology

## 2019-09-22 ENCOUNTER — Encounter: Payer: Self-pay | Admitting: Family Medicine

## 2019-09-22 ENCOUNTER — Ambulatory Visit (INDEPENDENT_AMBULATORY_CARE_PROVIDER_SITE_OTHER): Payer: BC Managed Care – PPO | Admitting: Family Medicine

## 2019-09-22 ENCOUNTER — Other Ambulatory Visit: Payer: Self-pay

## 2019-09-22 VITALS — BP 110/64 | HR 57 | Ht 67.0 in | Wt 156.0 lb

## 2019-09-22 DIAGNOSIS — M25512 Pain in left shoulder: Secondary | ICD-10-CM | POA: Diagnosis not present

## 2019-09-22 DIAGNOSIS — M999 Biomechanical lesion, unspecified: Secondary | ICD-10-CM | POA: Diagnosis not present

## 2019-09-22 DIAGNOSIS — G8929 Other chronic pain: Secondary | ICD-10-CM

## 2019-09-22 MED ORDER — TIZANIDINE HCL 4 MG PO TABS: 4.0000 mg | ORAL_TABLET | Freq: Four times a day (QID) | ORAL | 1 refills | Status: DC | PRN

## 2019-09-22 NOTE — Patient Instructions (Addendum)
Good to see you zanaflex sent in  Marcaine and triamcinolone  See me again in 6-8 weeks

## 2019-09-22 NOTE — Progress Notes (Signed)
[name] [address] [place] [address] Phone: [phone] Subjective:   I Christopher [name] am serving as a Neurosurgeon for Dr. Antoine Primas.  This visit occurred during the SARS-CoV-2 public health emergency.  Safety protocols were in place, including screening questions prior to the visit, additional usage of staff PPE, and extensive cleaning of exam room while observing appropriate contact time as indicated for disinfecting solutions.   I'm seeing this patient by the request  of:  Patient, No Pcp Per  CC: Left shoulder pain follow-up  XTG:GYIRSWNIOE  Christopher [name] is a 29 y.o. male coming in with complaint of back and neck pain. OMT 07/25/2019. Patient states his back is still painful. Neck pain is consistent everyday. States he is tight in the neck and shoulder. Feels knots in his shoulder. Pain is left sided.   Medications patient has been prescribed: Meloxicam  Taking: Yes         Reviewed prior external information including notes and imaging from previsou exam, outside providers and external EMR if available.   As well as notes that were available from care everywhere and other healthcare systems.  Past medical history, social, surgical and family history all reviewed in electronic medical record.  No pertanent information unless stated regarding to the chief complaint.   No past medical history on file.  No Known Allergies   Review of Systems:  No headache, visual changes, nausea, vomiting, diarrhea, constipation, dizziness, abdominal pain, skin rash, fevers, chills, night sweats, weight loss, swollen lymph nodes, body aches, joint swelling, chest pain, shortness of breath, mood changes. POSITIVE muscle aches  Objective  Blood pressure 110/64, pulse (!) 57, height 5\' 7"  (1.702 m), weight 156 lb (70.8 kg), SpO2 98 %.   General: No apparent distress alert and oriented x3 mood and affect normal, dressed appropriately.  HEENT:  Pupils equal, extraocular movements intact  Respiratory: Patient's speak in full sentences and does not appear short of breath  Cardiovascular: No lower extremity edema, non tender, no erythema  Neuro: Cranial nerves II through XII are intact, neurovascularly intact in all extremities with 2+ DTRs and 2+ pulses.  Gait normal with good balance and coordination.  MSK:  Non tender with full range of motion and good stability and symmetric strength and tone of shoulders, elbows, wrist, hip, knee and ankles bilaterally.  Back - Normal skin, Spine with normal alignment and no deformity.  No tenderness to vertebral process palpation.  Paraspinous muscles are not tender and without spasm.   Range of motion is full at neck and lumbar sacral regions  Osteopathic findings  C2 flexed rotated and side bent right C7 flexed rotated and side bent left T8 extended rotated and side bent left L1 flexed rotated and side bent right Sacrum right on right       Assessment and Plan:   Left shoulder pain Continues to have left shoulder pain.  Could be considered more of a acromioclavicular joint at this time.  We discussed topical anti-inflammatories now secondary to the longevity of this pain we would consider injection.  Increase activity as tolerated.  Patient would like to check with his significant other who is a .  Follow-up again in 6 to 8 weeks otherwise    Nonallopathic problems  Decision today to treat with OMT was based on Physical Exam  After verbal consent patient was treated with HVLA, ME, FPR techniques in cervical, rib, thoracic, lumbar, and sacral  areas  Patient  tolerated the procedure well with improvement in symptoms  Patient given exercises, stretches and lifestyle modifications  See medications in patient instructions if given  Patient will follow up in 4-8 weeks      The above documentation has been reviewed and is accurate and complete Judi Saa, DO        Note: This dictation was prepared with Dragon dictation along with smaller phrase technology. Any transcriptional errors that result from this process are unintentional.

## 2019-09-22 NOTE — Assessment & Plan Note (Signed)
Continues to have left shoulder pain.  Could be considered more of a acromioclavicular joint at this time.  We discussed topical anti-inflammatories now secondary to the longevity of this pain we would consider injection.  Increase activity as tolerated.  Patient would like to check with his significant other who is a Engineer, civil (consulting).  Follow-up again in 6 to 8 weeks otherwise

## 2019-11-19 ENCOUNTER — Ambulatory Visit: Payer: BC Managed Care – PPO | Admitting: Family Medicine

## 2019-12-03 ENCOUNTER — Encounter: Payer: Self-pay | Admitting: Family Medicine

## 2019-12-03 ENCOUNTER — Other Ambulatory Visit: Payer: Self-pay

## 2019-12-03 ENCOUNTER — Ambulatory Visit (INDEPENDENT_AMBULATORY_CARE_PROVIDER_SITE_OTHER): Payer: BC Managed Care – PPO | Admitting: Family Medicine

## 2019-12-03 VITALS — BP 118/86 | HR 63 | Ht 67.0 in | Wt 149.0 lb

## 2019-12-03 DIAGNOSIS — M545 Low back pain, unspecified: Secondary | ICD-10-CM

## 2019-12-03 DIAGNOSIS — M25512 Pain in left shoulder: Secondary | ICD-10-CM

## 2019-12-03 DIAGNOSIS — M999 Biomechanical lesion, unspecified: Secondary | ICD-10-CM | POA: Diagnosis not present

## 2019-12-03 DIAGNOSIS — S134XXA Sprain of ligaments of cervical spine, initial encounter: Secondary | ICD-10-CM

## 2019-12-03 DIAGNOSIS — G8929 Other chronic pain: Secondary | ICD-10-CM | POA: Diagnosis not present
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
## 2019-12-03 NOTE — Assessment & Plan Note (Signed)
Injections given today, tolerated the procedure well, discussed icing regimen and home exercise, which activities to do which wants to avoid.  Increase activity slowly.  Follow-up again in 4 to 8 weeks

## 2019-12-03 NOTE — Patient Instructions (Signed)
Good to see you PT referral sent in they will call you to schedule  Trigger points today See me again in 6-8 weeks
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
## 2019-12-29 DIAGNOSIS — Z20828 Contact with and (suspected) exposure to other viral communicable diseases: Secondary | ICD-10-CM | POA: Diagnosis not present

## 2020-01-19 NOTE — Progress Notes (Signed)
Christopher [name] [address] [place] [address] Phone: [phone] Subjective:   I Christopher [name] am serving as a Neurosurgeon for Dr. Antoine Primas.  This visit occurred during the SARS-CoV-2 public health emergency.  Safety protocols were in place, including screening questions prior to the visit, additional usage of staff PPE, and extensive cleaning of exam room while observing appropriate contact time as indicated for disinfecting solutions.   I'm seeing this patient by the request  of:  Patient, No Pcp Per  CC:low back pain follow up   IEP:PIRJJOACZY  Christopher [name] is a 29 y.o. male coming in with complaint of back and neck pain. OMT 12/03/2019. Patient states he is feeling better. Injection helped and states he has some discomfort with sleeping. Winged scapula on the left side in comparison to the right.  Did respond well to the trigger point injections previously.  Patient states it was helpful but still not all the way there.  Medications patient has been prescribed: Vit D, Mobic, Zanaflex          Reviewed prior external information including notes and imaging from previsou exam, outside providers and external EMR if available.   As well as notes that were available from care everywhere and other healthcare systems.  Past medical history, social, surgical and family history all reviewed in electronic medical record.  No pertanent information unless stated regarding to the chief complaint.   No past medical history on file.  No Known Allergies   Review of Systems:  No headache, visual changes, nausea, vomiting, diarrhea, constipation, dizziness, abdominal pain, skin rash, fevers, chills, night sweats, weight loss, swollen lymph nodes, , joint swelling, chest pain, shortness of breath, mood changes. POSITIVE muscle aches, body aches  Objective  Blood pressure 102/80, pulse 70, height 5\' 7"  (1.702 m), weight 154 lb (69.9 kg), SpO2 99 %.     General: No apparent distress alert and oriented x3 mood and affect normal, dressed appropriately.  HEENT: Pupils equal, extraocular movements intact  Respiratory: Patient's speak in full sentences and does not appear short of breath  Cardiovascular: No lower extremity edema, non tender, no erythema  Neuro: Cranial nerves II through XII are intact, neurovascularly intact in all extremities with 2+ DTRs and 2+ pulses.  Gait normal with good balance and coordination.  MSK:  Non tender with full range of motion and good stability and symmetric strength and tone of shoulders, elbows, wrist, hip, knee and ankles bilaterally.  Back -mild loss of lordosis.  Patient though has more tightness noted around the left trapezius.  Patient's neck mild loss of lordosis as well.  Good range of motion.  Patient has decent range of motion of the scapulas as well.    Osteopathic findings  C6 flexed rotated and side bent left T4 extended rotated and side bent left inhaled rib L2 flexed rotated and side bent left Sacrum right on right       Assessment and Plan:    Nonallopathic problems  Decision today to treat with OMT was based on Physical Exam  After verbal consent patient was treated with HVLA, ME, FPR techniques in cervical, rib, thoracic, lumbar, and sacral  areas  Patient tolerated the procedure well with improvement in symptoms  Patient given exercises, stretches and lifestyle modifications  See medications in patient instructions if given  Patient will follow up in 4-8 weeks      The above documentation has been reviewed and is accurate and  complete Lyndal Pulley, DO       Note: This dictation was prepared with Dragon dictation along with smaller phrase technology. Any transcriptional errors that result from this process are unintentional.

## 2020-01-20 ENCOUNTER — Encounter: Payer: Self-pay | Admitting: Family Medicine

## 2020-01-20 ENCOUNTER — Other Ambulatory Visit: Payer: Self-pay

## 2020-01-20 ENCOUNTER — Ambulatory Visit (INDEPENDENT_AMBULATORY_CARE_PROVIDER_SITE_OTHER): Payer: BC Managed Care – PPO | Admitting: Family Medicine

## 2020-01-20 VITALS — BP 102/80 | HR 70 | Ht 67.0 in | Wt 154.0 lb

## 2020-01-20 DIAGNOSIS — E559 Vitamin D deficiency, unspecified: Secondary | ICD-10-CM

## 2020-01-20 DIAGNOSIS — S134XXA Sprain of ligaments of cervical spine, initial encounter: Secondary | ICD-10-CM

## 2020-01-20 DIAGNOSIS — M999 Biomechanical lesion, unspecified: Secondary | ICD-10-CM

## 2020-01-20 MED ORDER — MELOXICAM 7.5 MG PO TABS: 7.5000 mg | ORAL_TABLET | Freq: Every day | ORAL | 0 refills | Status: DC

## 2020-01-20 MED ORDER — TIZANIDINE HCL 4 MG PO TABS: 4.0000 mg | ORAL_TABLET | Freq: Every day | ORAL | 1 refills | Status: DC
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
## 2020-01-20 NOTE — Assessment & Plan Note (Signed)
Patient has been on once weekly.  We will need to recheck values her primary care well.

## 2020-01-20 NOTE — Patient Instructions (Signed)
Refilled muscle relaxer  Take meloxicam instead of Ibuprofen PT will be helpful See me again in 4-6 weeks

## 2020-01-22 ENCOUNTER — Telehealth: Payer: Self-pay | Admitting: Family Medicine

## 2020-01-22 ENCOUNTER — Encounter: Payer: Self-pay | Admitting: Physical Therapy

## 2020-01-22 ENCOUNTER — Ambulatory Visit: Payer: BC Managed Care – PPO | Attending: Family Medicine | Admitting: Physical Therapy

## 2020-01-22 ENCOUNTER — Other Ambulatory Visit: Payer: Self-pay

## 2020-01-22 DIAGNOSIS — G8929 Other chronic pain: Secondary | ICD-10-CM | POA: Diagnosis not present

## 2020-01-22 DIAGNOSIS — M6281 Muscle weakness (generalized): Secondary | ICD-10-CM

## 2020-01-22 DIAGNOSIS — M545 Low back pain, unspecified: Secondary | ICD-10-CM | POA: Diagnosis not present

## 2020-01-22 NOTE — Patient Instructions (Signed)
To activate internal oblique:  Empty air out of the lungs   Lying face down over 1 pillow:  Arm extension with opposite leg lift exhaling completely.     Lavinia Sharps PT [facility] [address] [place], [place] [address] Phone # [phone] Fax [phone]

## 2020-01-22 NOTE — Telephone Encounter (Signed)
Morrie Sheldon from [facility] called. Patient is complaining more about shoulder pain than the LBP. She would like our approval to address the shoulder pain via PT, but this will need to be added to the original referral notes.  Morrie Sheldon [phone]
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
## 2020-01-23 ENCOUNTER — Other Ambulatory Visit: Payer: Self-pay

## 2020-01-23 DIAGNOSIS — M25512 Pain in left shoulder: Secondary | ICD-10-CM

## 2020-01-23 NOTE — Telephone Encounter (Signed)
Call physical therapy to let them know order has been placed.

## 2020-01-29 ENCOUNTER — Ambulatory Visit: Payer: BC Managed Care – PPO | Attending: Family Medicine | Admitting: Physical Therapy

## 2020-01-29 ENCOUNTER — Other Ambulatory Visit: Payer: Self-pay

## 2020-01-29 DIAGNOSIS — G8929 Other chronic pain: Secondary | ICD-10-CM | POA: Insufficient documentation

## 2020-01-29 DIAGNOSIS — M6281 Muscle weakness (generalized): Secondary | ICD-10-CM | POA: Diagnosis not present

## 2020-01-29 DIAGNOSIS — M25512 Pain in left shoulder: Secondary | ICD-10-CM | POA: Diagnosis not present

## 2020-01-29 DIAGNOSIS — M545 Low back pain, unspecified: Secondary | ICD-10-CM | POA: Diagnosis not present
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
## 2020-01-29 NOTE — Patient Instructions (Addendum)
Trigger Point Dry Needling  . What is Trigger Point Dry Needling (DN)? o DN is a physical therapy technique used to treat muscle pain and dysfunction. Specifically, DN helps deactivate muscle trigger points (muscle knots).  o A thin filiform needle is used to penetrate the skin and stimulate the underlying trigger point. The goal is for a local twitch response (LTR) to occur and for the trigger point to relax. No medication of any kind is injected during the procedure.   . What Does Trigger Point Dry Needling Feel Like?  o The procedure feels different for each individual patient. Some patients report that they do not actually feel the needle enter the skin and overall the process is not painful. Very mild bleeding may occur. However, many patients feel a deep cramping in the muscle in which the needle was inserted. This is the local twitch response.   Marland Kitchen How Will I feel after the treatment? o Soreness is normal, and the onset of soreness may not occur for a few hours. Typically this soreness does not last longer than two days.  o Bruising is uncommon, however; ice can be used to decrease any possible bruising.  o In rare cases feeling tired or nauseous after the treatment is normal. In addition, your symptoms may get worse before they get better, this period will typically not last longer than 24 hours.   . What Can I do After My Treatment? o Increase your hydration by drinking more water for the next 24 hours. o You may place ice or heat on the areas treated that have become sore, however, do not use heat on inflamed or bruised areas. Heat often brings more relief post needling. o You can continue your regular activities, but vigorous activity is not recommended initially after the treatment for 24 hours. o DN is best combined with other physical therapy such as strengthening, stretching, and other therapies.  Access Code: [email or web address] URL: [email or web address] Date:  01/29/2020 Prepared by: Lavinia Sharps  Exercises Prone Shoulder Extension - Single Arm - 1 x daily - 7 x weekly - 2 sets - 10 reps Prone Shoulder Horizontal Abduction - 1 x daily - 7 x weekly - 2 sets - 10 reps Prone Single Arm Shoulder Y - 1 x daily - 7 x weekly - 2 sets - 10 reps Kettlebell Deadlift - 1 x daily - 7 x weekly - 1 sets - 10 reps

## 2020-02-05 ENCOUNTER — Ambulatory Visit: Payer: BC Managed Care – PPO | Admitting: Physical Therapy

## 2020-02-05 ENCOUNTER — Other Ambulatory Visit: Payer: Self-pay

## 2020-02-05 DIAGNOSIS — G8929 Other chronic pain: Secondary | ICD-10-CM | POA: Diagnosis not present

## 2020-02-05 DIAGNOSIS — M545 Low back pain, unspecified: Secondary | ICD-10-CM | POA: Diagnosis not present

## 2020-02-05 DIAGNOSIS — M6281 Muscle weakness (generalized): Secondary | ICD-10-CM | POA: Diagnosis not present

## 2020-02-05 DIAGNOSIS — M25512 Pain in left shoulder: Secondary | ICD-10-CM | POA: Diagnosis not present
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
## 2020-02-12 ENCOUNTER — Ambulatory Visit: Payer: BC Managed Care – PPO | Admitting: Physical Therapy

## 2020-02-12 ENCOUNTER — Other Ambulatory Visit: Payer: Self-pay

## 2020-02-12 DIAGNOSIS — M6281 Muscle weakness (generalized): Secondary | ICD-10-CM

## 2020-02-12 DIAGNOSIS — G8929 Other chronic pain: Secondary | ICD-10-CM

## 2020-02-12 DIAGNOSIS — M545 Low back pain, unspecified: Secondary | ICD-10-CM | POA: Diagnosis not present

## 2020-02-12 DIAGNOSIS — M25512 Pain in left shoulder: Secondary | ICD-10-CM

## 2020-02-12 NOTE — Patient Instructions (Signed)
Access Code: [email or web address] URL: [email or web address] Date: 02/12/2020 Prepared by: Lavinia Sharps  Exercises Prone Shoulder Extension - Single Arm - 1 x daily - 7 x weekly - 2 sets - 10 reps Prone Shoulder Horizontal Abduction - 1 x daily - 7 x weekly - 2 sets - 10 reps Prone Single Arm Shoulder Y - 1 x daily - 7 x weekly - 2 sets - 10 reps Kettlebell Deadlift - 1 x daily - 7 x weekly - 1 sets - 10 reps Supine Resisted Serratus Anterior Punches with Elbow Bent - 1 x daily - 7 x weekly - 2 sets - 10 reps Full Plank with Scapular Protraction Retraction AROM - 1 x daily - 7 x weekly - 1 sets - 10 reps Shoulder External Rotation and Scapular Retraction with Resistance - 1 x daily - 7 x weekly - 2 sets - 10 reps Seated Shoulder Horizontal Abduction with Resistance - Thumbs Up - 1 x daily - 7 x weekly - 2 sets - 10 reps
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
## 2020-02-23 ENCOUNTER — Encounter: Payer: BC Managed Care – PPO | Admitting: Physical Therapy

## 2020-02-26 ENCOUNTER — Other Ambulatory Visit: Payer: Self-pay

## 2020-02-26 ENCOUNTER — Ambulatory Visit (INDEPENDENT_AMBULATORY_CARE_PROVIDER_SITE_OTHER): Payer: BC Managed Care – PPO | Admitting: Family Medicine

## 2020-02-26 ENCOUNTER — Ambulatory Visit (INDEPENDENT_AMBULATORY_CARE_PROVIDER_SITE_OTHER): Payer: BC Managed Care – PPO

## 2020-02-26 ENCOUNTER — Encounter: Payer: Self-pay | Admitting: Family Medicine

## 2020-02-26 VITALS — BP 100/70 | HR 74 | Ht 67.0 in | Wt 154.0 lb

## 2020-02-26 DIAGNOSIS — M542 Cervicalgia: Secondary | ICD-10-CM

## 2020-02-26 DIAGNOSIS — G8929 Other chronic pain: Secondary | ICD-10-CM

## 2020-02-26 DIAGNOSIS — M25512 Pain in left shoulder: Secondary | ICD-10-CM

## 2020-02-26 DIAGNOSIS — M999 Biomechanical lesion, unspecified: Secondary | ICD-10-CM

## 2020-02-26 NOTE — Progress Notes (Signed)
[name] [address] [place] [address] Phone: [phone] Subjective:   Christopher [name], am serving as a scribe for Dr. Antoine Primas. This visit occurred during the SARS-CoV-2 public health emergency.  Safety protocols were in place, including screening questions prior to the visit, additional usage of staff PPE, and extensive cleaning of exam room while observing appropriate contact time as indicated for disinfecting solutions.   I'm seeing this patient by the request  of:  Patient, No Pcp Per  CC: Left shoulder pain follow-up  PIR:[email or web address]  Christopher [name] is a 29 y.o. male coming in with complaint of back and neck pain. OMT 01/20/2020. Patient states that his shoulder is doing alright that PT an dry needling really seem to be helping.  Patient unfortunately had this exacerbation since in January 2021.  Patient has been making progress but still states that he is only 70% better overall.  Patient states that it continues to give him difficulty.  States that he can sleep more comfortably and does not need the medications quite as regularly.  Medications patient has been prescribed: Meloxicam, Vit D, Zanaflex  Taking: Meloxicam, Vit D, Zanaflex          Reviewed prior external information including notes and imaging from previsou exam, outside providers and external EMR if available.   As well as notes that were available from care everywhere and other healthcare systems.  Past medical history, social, surgical and family history all reviewed in electronic medical record.  No pertanent information unless stated regarding to the chief complaint.   History reviewed. No pertinent past medical history.  No Known Allergies   Review of Systems:  No headache, visual changes, nausea, vomiting, diarrhea, constipation, dizziness, abdominal pain, skin rash, fevers, chills, night sweats, weight loss, swollen lymph nodes, body aches, joint  swelling, chest pain, shortness of breath, mood changes. POSITIVE muscle aches  Objective  Blood pressure 100/70, pulse 74, height 5\' 7"  (1.702 m), weight 154 lb (69.9 kg), SpO2 97 %.   General: No apparent distress alert and oriented x3 mood and affect normal, dressed appropriately.  HEENT: Pupils equal, extraocular movements intact  Respiratory: Patient's speak in full sentences and does not appear short of breath  Cardiovascular: No lower extremity edema, non tender, no erythema  Neuro: Cranial nerves II through XII are intact, neurovascularly intact in all extremities with 2+ DTRs and 2+ pulses.  Gait normal with good balance and coordination.  Neck exam does have some mild loss of lordosis.  Still some mild scapular dyskinesis noted left greater than right.  Patient does have some mild positive O'Brien's on the left side.  Full range of motion and full strength of the shoulder noted.  Neck exam still has some tightness on the left than the right.  Patient still lacks the last 5 degrees of extension.  Negative Spurling's though noted today.  Osteopathic findings  C2 flexed rotated and side bent left C6 flexed rotated and side bent left T3 extended rotated and side bent left inhaled rib L2 flexed rotated and side bent right Sacrum right on right       Assessment and Plan:  Left shoulder pain Continuous to have left shoulder pain, still seems to be more secondary to the scapula but now is having some signs with a positive O'Brien's.  We discussed that this could be some labral pathology.  Patient has been very diligent doing the exercises and still only about 70% better  than where he has Started.  Patient has the Zanaflex and the meloxicam.  Attempted osteopathic manipulation again.  X-rays of the shoulder and the neck ordered today for further evaluation.  At this point the patient continues to have difficulty I do feel MRI would be necessary.  Patient is in agreement with the plan and  will follow up in 6 to 8 weeks    Nonallopathic problems  Decision today to treat with OMT was based on Physical Exam  After verbal consent patient was treated with HVLA, ME, FPR techniques in cervical, rib, thoracic, lumbar, and sacral  areas  Patient tolerated the procedure well with improvement in symptoms  Patient given exercises, stretches and lifestyle modifications  See medications in patient instructions if given  Patient will follow up in 5-6 weeks      The above documentation has been reviewed and is accurate and complete Judi Saa, DO       Note: This dictation was prepared with Dragon dictation along with smaller phrase technology. Any transcriptional errors that result from this process are unintentional.

## 2020-02-26 NOTE — Patient Instructions (Signed)
Neck and left shoulder xray Continue with PT See me in 5-6 weeks, if not better let's consider MRI

## 2020-02-26 NOTE — Assessment & Plan Note (Signed)
Continuous to have left shoulder pain, still seems to be more secondary to the scapula but now is having some signs with a positive O'Brien's.  We discussed that this could be some labral pathology.  Patient has been very diligent doing the exercises and still only about 70% better than where he has Started.  Patient has the Zanaflex and the meloxicam.  Attempted osteopathic manipulation again.  X-rays of the shoulder and the neck ordered today for further evaluation.  At this point the patient continues to have difficulty I do feel MRI would be necessary.  Patient is in agreement with the plan and will follow up in 6 to 8 weeks

## 2020-03-03 ENCOUNTER — Encounter: Payer: BC Managed Care – PPO | Admitting: Physical Therapy

## 2020-03-04 DIAGNOSIS — F4322 Adjustment disorder with anxiety: Secondary | ICD-10-CM | POA: Diagnosis not present

## 2020-03-11 ENCOUNTER — Ambulatory Visit: Payer: BC Managed Care – PPO | Attending: Family Medicine | Admitting: Physical Therapy

## 2020-03-11 ENCOUNTER — Other Ambulatory Visit: Payer: Self-pay

## 2020-03-11 DIAGNOSIS — M6281 Muscle weakness (generalized): Secondary | ICD-10-CM | POA: Insufficient documentation

## 2020-03-11 DIAGNOSIS — M545 Low back pain, unspecified: Secondary | ICD-10-CM | POA: Diagnosis present

## 2020-03-11 DIAGNOSIS — M25512 Pain in left shoulder: Secondary | ICD-10-CM | POA: Diagnosis present

## 2020-03-11 DIAGNOSIS — G8929 Other chronic pain: Secondary | ICD-10-CM

## 2020-03-11 NOTE — Patient Instructions (Signed)
Access Code: [email or web address] URL: [email or web address] Date: 03/11/2020 Prepared by: Lavinia Sharps  Exercises Prone Shoulder Extension - Single Arm - 1 x daily - 7 x weekly - 2 sets - 10 reps Prone Shoulder Horizontal Abduction - 1 x daily - 7 x weekly - 2 sets - 10 reps Prone Single Arm Shoulder Y - 1 x daily - 7 x weekly - 2 sets - 10 reps Kettlebell Deadlift - 1 x daily - 7 x weekly - 1 sets - 10 reps Supine Resisted Serratus Anterior Punches with Elbow Bent - 1 x daily - 7 x weekly - 2 sets - 10 reps Full Plank with Scapular Protraction Retraction AROM - 1 x daily - 7 x weekly - 1 sets - 10 reps Shoulder External Rotation and Scapular Retraction with Resistance - 1 x daily - 7 x weekly - 2 sets - 10 reps Seated Shoulder Horizontal Abduction with Resistance - Thumbs Up - 1 x daily - 7 x weekly - 2 sets - 10 reps Prone Scapular Retraction Arms at Side - 1 x daily - 7 x weekly - 2 sets - 10 reps Standing Anti-Rotation Press with Anchored Resistance - 1 x daily - 7 x weekly - 1 sets - 10 reps Half Kneeling Anti-Rotation Press - Forward Leg Anchor Side - 1 x daily - 7 x weekly - 1 sets - 10 reps
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
## 2020-03-17 ENCOUNTER — Encounter: Payer: Self-pay | Admitting: Physical Therapy

## 2020-03-17 ENCOUNTER — Ambulatory Visit: Payer: BC Managed Care – PPO | Admitting: Physical Therapy

## 2020-03-17 ENCOUNTER — Other Ambulatory Visit: Payer: Self-pay

## 2020-03-17 DIAGNOSIS — M545 Low back pain, unspecified: Secondary | ICD-10-CM

## 2020-03-17 DIAGNOSIS — M25512 Pain in left shoulder: Secondary | ICD-10-CM

## 2020-03-17 DIAGNOSIS — M6281 Muscle weakness (generalized): Secondary | ICD-10-CM

## 2020-03-17 DIAGNOSIS — G8929 Other chronic pain: Secondary | ICD-10-CM
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
## 2020-03-30 ENCOUNTER — Encounter: Payer: BC Managed Care – PPO | Admitting: Physical Therapy

## 2020-04-01 ENCOUNTER — Other Ambulatory Visit: Payer: Self-pay

## 2020-04-01 ENCOUNTER — Encounter: Payer: Self-pay | Admitting: Family Medicine

## 2020-04-01 ENCOUNTER — Ambulatory Visit (INDEPENDENT_AMBULATORY_CARE_PROVIDER_SITE_OTHER): Payer: BC Managed Care – PPO | Admitting: Family Medicine

## 2020-04-01 VITALS — BP 118/70 | HR 68 | Ht 67.0 in | Wt 144.0 lb

## 2020-04-01 DIAGNOSIS — M24551 Contracture, right hip: Secondary | ICD-10-CM | POA: Diagnosis not present

## 2020-04-01 DIAGNOSIS — M25512 Pain in left shoulder: Secondary | ICD-10-CM | POA: Diagnosis not present

## 2020-04-01 DIAGNOSIS — G8929 Other chronic pain: Secondary | ICD-10-CM

## 2020-04-01 DIAGNOSIS — M999 Biomechanical lesion, unspecified: Secondary | ICD-10-CM

## 2020-04-01 NOTE — Progress Notes (Signed)
[name] [address] [place] [address] Phone: [phone] Subjective:   I Christopher [name] am serving as a Neurosurgeon for Dr. Antoine Primas.  This visit occurred during the SARS-CoV-2 public health emergency.  Safety protocols were in place, including screening questions prior to the visit, additional usage of staff PPE, and extensive cleaning of exam room while observing appropriate contact time as indicated for disinfecting solutions.   I'm seeing this patient by the request  of:  Patient, No Pcp Per  CC: Shoulder pain and back pain follow-up  BDZ:[id number]   01/27/2020 Continuous to have left shoulder pain, still seems to be more secondary to the scapula but now is having some signs with a positive O'Brien's.  We discussed that this could be some labral pathology.  Patient has been very diligent doing the exercises and still only about 70% better than where he has Started.  Patient has the Zanaflex and the meloxicam.  Attempted osteopathic manipulation again.  X-rays of the shoulder and the neck ordered today for further evaluation.  At this point the patient continues to have difficulty I do feel MRI would be necessary.  Patient is in agreement with the plan and will follow up in 6 to 8 weeks   Update 04/01/2020 Christopher [name] is a 30 y.o. male coming in with complaint of shoulder pain. OMT 01/27/2020. Also having left shoulder pain. Patient states PT has helped. Today it is TTP. States the back hasn't been bothering him as much. Pain with sitting on the right side.  Low back seems to be more on the right side.  No radicular symptoms.  Worse with sitting for long amount of time.  Left shoulder has made significant improvement with physical therapy but still not pain-free.  Still cannot sleep on that side for a long duration.  Has not been working out regularly     X-rays at last exam of the cervical spine and the left shoulder were unremarkable.  These  were independently visualized by me   Reviewed prior external information including notes and imaging from previsou exam, outside providers and external EMR if available.   As well as notes that were available from care everywhere and other healthcare systems.  Past medical history, social, surgical and family history all reviewed in electronic medical record.  No pertanent information unless stated regarding to the chief complaint.   No past medical history on file.  No Known Allergies   Review of Systems:  No headache, visual changes, nausea, vomiting, diarrhea, constipation, dizziness, abdominal pain, skin rash, fevers, chills, night sweats, weight loss, swollen lymph nodes, , joint swelling, chest pain, shortness of breath, mood changes. POSITIVE muscle aches, body aches  Objective  Blood pressure 118/70, pulse 68, height 5\' 7"  (1.702 m), weight 144 lb (65.3 kg), SpO2 97 %.   General: No apparent distress alert and oriented x3 mood and affect normal, dressed appropriately.  HEENT: Pupils equal, extraocular movements intact  Respiratory: Patient's speak in full sentences and does not appear short of breath  Cardiovascular: No lower extremity edema, non tender, no erythema  Neuro: Cranial nerves II through XII are intact, neurovascularly intact in all extremities with 2+ DTRs and 2+ pulses.  Gait normal with good balance and coordination.  MSK:  Non tender with full range of motion and good stability and symmetric strength and tone of shoulders, elbows, wrist, hip, knee and ankles bilaterally.  Back - Normal skin, Spine with normal alignment  and no deformity.  No tenderness to vertebral process palpation.  Paraspinous muscles are not tender and without spasm.   Range of motion is full at neck and lumbar sacral regions  Osteopathic findings  C6 flexed rotated and side bent left T3 extended rotated and side bent left inhaled rib L2 flexed rotated and side bent right Sacrum right on  right       Assessment and Plan: Hip flexor tightness, right Continue tightness of the hip flexors.  Has responded well to osteopathic manipulation as well as the formal physical therapy.  We discussed posture and ergonomics, which activities to do which wants to avoid.  Patient will increase activity slowly.  Follow-up again 6 to 8 weeks.  Left shoulder pain Still concern for potential O'Brien's being positive in the labral pathology.  Patient's x-rays have been unremarkable.  Patient wants to avoid any type of surgical intervention and does not want the MRI.  Patient feels that the physical therapy has been helpful.  Patient will continue with conservative therapy but will start to increase activity at this time.  Follow-up with me again 6 weeks.  With patient increasing activity if worsening pain I would recommend the MR arthrogram.     Nonallopathic problems  Decision today to treat with OMT was based on Physical Exam  After verbal consent patient was treated with HVLA, ME, FPR techniques in cervical, rib, thoracic, lumbar, and sacral  areas  Patient tolerated the procedure well with improvement in symptoms  Patient given exercises, stretches and lifestyle modifications  See medications in patient instructions if given  Patient will follow up in 4-8 weeks      The above documentation has been reviewed and is accurate and complete Judi Saa, DO       Note: This dictation was prepared with Dragon dictation along with smaller phrase technology. Any transcriptional errors that result from this process are unintentional.

## 2020-04-01 NOTE — Assessment & Plan Note (Signed)
Continue tightness of the hip flexors.  Has responded well to osteopathic manipulation as well as the formal physical therapy.  We discussed posture and ergonomics, which activities to do which wants to avoid.  Patient will increase activity slowly.  Follow-up again 6 to 8 weeks.

## 2020-04-01 NOTE — Assessment & Plan Note (Signed)
Still concern for potential O'Brien's being positive in the labral pathology.  Patient's x-rays have been unremarkable.  Patient wants to avoid any type of surgical intervention and does not want the MRI.  Patient feels that the physical therapy has been helpful.  Patient will continue with conservative therapy but will start to increase activity at this time.  Follow-up with me again 6 weeks.  With patient increasing activity if worsening pain I would recommend the MR arthrogram.

## 2020-04-01 NOTE — Patient Instructions (Signed)
Good to see you Glad you are doing better Decrease to 50 % weight then increase 10% a week If worse let us know we will order MRI of shoulder See me again in 6-8 weeks

## 2020-04-06 ENCOUNTER — Ambulatory Visit: Payer: Self-pay | Admitting: Physician Assistant

## 2020-04-06 ENCOUNTER — Other Ambulatory Visit: Payer: Self-pay

## 2020-04-06 ENCOUNTER — Encounter: Payer: Self-pay | Admitting: Physician Assistant

## 2020-04-06 DIAGNOSIS — Z113 Encounter for screening for infections with a predominantly sexual mode of transmission: Secondary | ICD-10-CM

## 2020-04-06 DIAGNOSIS — B977 Papillomavirus as the cause of diseases classified elsewhere: Secondary | ICD-10-CM

## 2020-04-06 LAB — GRAM STAIN

## 2020-04-06 NOTE — Progress Notes (Unsigned)
Pt is here for STI screening. 

## 2020-04-07 ENCOUNTER — Encounter: Payer: Self-pay | Admitting: Physician Assistant
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
## 2020-04-08 ENCOUNTER — Ambulatory Visit: Payer: BC Managed Care – PPO

## 2020-04-11 LAB — GONOCOCCUS CULTURE

## 2020-04-16 ENCOUNTER — Encounter: Payer: Self-pay | Admitting: Student

## 2020-04-16 LAB — HM HIV SCREENING LAB: HM HIV Screening: NEGATIVE

## 2020-05-13 ENCOUNTER — Ambulatory Visit: Payer: BC Managed Care – PPO | Admitting: Family Medicine
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
## 2020-05-27 ENCOUNTER — Encounter: Payer: Self-pay | Admitting: Family Medicine

## 2020-05-27 ENCOUNTER — Ambulatory Visit (INDEPENDENT_AMBULATORY_CARE_PROVIDER_SITE_OTHER): Payer: BC Managed Care – PPO | Admitting: Family Medicine

## 2020-05-27 ENCOUNTER — Other Ambulatory Visit: Payer: Self-pay

## 2020-05-27 VITALS — BP 118/74 | HR 72 | Ht 67.0 in | Wt 142.0 lb

## 2020-05-27 DIAGNOSIS — M25512 Pain in left shoulder: Secondary | ICD-10-CM | POA: Diagnosis not present

## 2020-05-27 DIAGNOSIS — M24551 Contracture, right hip: Secondary | ICD-10-CM

## 2020-05-27 DIAGNOSIS — M999 Biomechanical lesion, unspecified: Secondary | ICD-10-CM | POA: Diagnosis not present

## 2020-05-27 DIAGNOSIS — G8929 Other chronic pain: Secondary | ICD-10-CM
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
## 2020-05-27 NOTE — Patient Instructions (Addendum)
[facility] [phone] Please sign up for MyChart See me again in 6-8 weeks

## 2020-05-27 NOTE — Assessment & Plan Note (Signed)
Mild tightness noted.  Has been relatively active though previously to the last 2 months.  Now tried to run one time and started having tightness.  Does respond well to manipulation.  Follow-up with me again 6 to 8 weeks

## 2020-06-10 ENCOUNTER — Other Ambulatory Visit: Payer: Self-pay | Admitting: Urology

## 2020-06-10 DIAGNOSIS — R310 Gross hematuria: Secondary | ICD-10-CM

## 2020-06-25 ENCOUNTER — Ambulatory Visit
Admission: RE | Admit: 2020-06-25 | Discharge: 2020-06-25 | Disposition: A | Discharge status: Home / Self Care | Payer: BC Managed Care – PPO | Source: Ambulatory Visit | Attending: Family Medicine | Admitting: Family Medicine

## 2020-06-25 DIAGNOSIS — M25512 Pain in left shoulder: Secondary | ICD-10-CM

## 2020-06-25 DIAGNOSIS — G8929 Other chronic pain: Secondary | ICD-10-CM

## 2020-06-25 MED ORDER — IOPAMIDOL (ISOVUE-M 200) INJECTION 41%
15.0000 mL | Freq: Once | INTRAMUSCULAR | Status: AC
  Administered 2020-06-25: 15 mL via INTRA_ARTICULAR

## 2020-06-29 ENCOUNTER — Ambulatory Visit
Admission: RE | Admit: 2020-06-29 | Discharge: 2020-06-29 | Disposition: A | Discharge status: Home / Self Care | Payer: BC Managed Care – PPO | Source: Ambulatory Visit | Attending: Urology | Admitting: Urology

## 2020-06-29 ENCOUNTER — Other Ambulatory Visit: Payer: Self-pay

## 2020-06-29 DIAGNOSIS — R310 Gross hematuria: Secondary | ICD-10-CM | POA: Diagnosis not present

## 2020-06-29 MED ORDER — IOHEXOL 300 MG/ML  SOLN
125.0000 mL | Freq: Once | INTRAMUSCULAR | Status: AC | PRN
  Administered 2020-06-29: 125 mL via INTRAVENOUS

## 2020-07-15 ENCOUNTER — Encounter: Payer: Self-pay | Admitting: Family Medicine

## 2020-07-15 ENCOUNTER — Other Ambulatory Visit: Payer: Self-pay

## 2020-07-15 ENCOUNTER — Ambulatory Visit: Payer: Self-pay

## 2020-07-15 ENCOUNTER — Ambulatory Visit (INDEPENDENT_AMBULATORY_CARE_PROVIDER_SITE_OTHER): Payer: BC Managed Care – PPO | Admitting: Family Medicine

## 2020-07-15 VITALS — BP 110/76 | HR 55 | Ht 67.0 in | Wt 145.0 lb

## 2020-07-15 DIAGNOSIS — M9903 Segmental and somatic dysfunction of lumbar region: Secondary | ICD-10-CM | POA: Diagnosis not present

## 2020-07-15 DIAGNOSIS — M999 Biomechanical lesion, unspecified: Secondary | ICD-10-CM | POA: Diagnosis not present

## 2020-07-15 DIAGNOSIS — M25512 Pain in left shoulder: Secondary | ICD-10-CM

## 2020-07-15 DIAGNOSIS — M9904 Segmental and somatic dysfunction of sacral region: Secondary | ICD-10-CM

## 2020-07-15 DIAGNOSIS — M9902 Segmental and somatic dysfunction of thoracic region: Secondary | ICD-10-CM | POA: Diagnosis not present

## 2020-07-15 DIAGNOSIS — M9908 Segmental and somatic dysfunction of rib cage: Secondary | ICD-10-CM | POA: Diagnosis not present

## 2020-07-15 DIAGNOSIS — G8929 Other chronic pain: Secondary | ICD-10-CM | POA: Diagnosis not present

## 2020-07-15 DIAGNOSIS — M9901 Segmental and somatic dysfunction of cervical region: Secondary | ICD-10-CM

## 2020-07-15 MED ORDER — PREDNISONE 20 MG PO TABS
20.0000 mg | ORAL_TABLET | Freq: Every day | ORAL | 0 refills | Status: DC
Start: 2020-07-15 — End: 2022-02-15
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
## 2020-07-15 NOTE — Patient Instructions (Signed)
US shows inflammation Prednisone 20mg  daily for 5 days Do not use NSAIDS such as Advil or Aleve when taking Prednisone It is ok to use Tylenol for additional pain relief Ice after work and working out is MRI shows nothing that would be surgical See me again in 2 months

## 2020-07-15 NOTE — Assessment & Plan Note (Signed)

## 2020-07-15 NOTE — Assessment & Plan Note (Signed)
Patient's left shoulder on MRI did not show any significant findings.  On ultrasound today patient does have a very mild subacromial bursitis and very mild effusion noted of the acromioclavicular joint.  Given a very low dose of prednisone.  Patient did respond well to the meloxicam previously but we will see how patient does with a 5-day burst of the prednisone.  Patient has muscle relaxers otherwise.  We discussed that it may be there could be some potential concern for the neck but I think it is highly unlikely.  Responding well to manipulation otherwise.  Follow-up with me again in 6 to 8 weeks

## 2020-07-23 ENCOUNTER — Ambulatory Visit: Payer: Self-pay | Admitting: Physician Assistant

## 2020-07-23 ENCOUNTER — Other Ambulatory Visit: Payer: Self-pay

## 2020-07-23 DIAGNOSIS — Z113 Encounter for screening for infections with a predominantly sexual mode of transmission: Secondary | ICD-10-CM

## 2020-07-23 LAB — GRAM STAIN

## 2020-07-24 ENCOUNTER — Encounter: Payer: Self-pay | Admitting: Physician Assistant

## 2020-07-24 LAB — HM HIV SCREENING LAB: HM HIV Screening: NEGATIVE
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
## 2020-07-27 LAB — GONOCOCCUS CULTURE

## 2020-08-25 ENCOUNTER — Encounter: Payer: Self-pay | Admitting: Physician Assistant

## 2020-08-25 ENCOUNTER — Ambulatory Visit: Payer: Self-pay | Admitting: Physician Assistant

## 2020-08-25 ENCOUNTER — Other Ambulatory Visit: Payer: Self-pay

## 2020-08-25 DIAGNOSIS — Z113 Encounter for screening for infections with a predominantly sexual mode of transmission: Secondary | ICD-10-CM

## 2020-08-25 DIAGNOSIS — B977 Papillomavirus as the cause of diseases classified elsewhere: Secondary | ICD-10-CM

## 2020-08-25 LAB — GRAM STAIN
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
## 2020-08-29 LAB — GONOCOCCUS CULTURE

## 2020-08-30 ENCOUNTER — Ambulatory Visit
Admission: EM | Admit: 2020-08-30 | Discharge: 2020-08-30 | Disposition: A | Discharge status: Home / Self Care | Payer: BC Managed Care – PPO | Attending: Physician Assistant | Admitting: Physician Assistant

## 2020-08-30 ENCOUNTER — Other Ambulatory Visit: Payer: Self-pay

## 2020-08-30 ENCOUNTER — Encounter: Payer: Self-pay | Admitting: Emergency Medicine

## 2020-08-30 DIAGNOSIS — Z751 Person awaiting admission to adequate facility elsewhere: Secondary | ICD-10-CM

## 2020-08-30 DIAGNOSIS — S61412A Laceration without foreign body of left hand, initial encounter: Secondary | ICD-10-CM | POA: Diagnosis not present

## 2020-08-30 DIAGNOSIS — W278XXA Contact with other nonpowered hand tool, initial encounter: Secondary | ICD-10-CM | POA: Diagnosis not present

## 2020-08-30 DIAGNOSIS — R200 Anesthesia of skin: Secondary | ICD-10-CM | POA: Diagnosis not present

## 2020-08-30 DIAGNOSIS — Z23 Encounter for immunization: Secondary | ICD-10-CM | POA: Diagnosis not present

## 2020-08-30 DIAGNOSIS — S6990XA Unspecified injury of unspecified wrist, hand and finger(s), initial encounter: Secondary | ICD-10-CM | POA: Diagnosis not present

## 2020-08-30 DIAGNOSIS — S61012A Laceration without foreign body of left thumb without damage to nail, initial encounter: Secondary | ICD-10-CM | POA: Diagnosis not present

## 2020-08-30 MED ORDER — TETANUS-DIPHTH-ACELL PERTUSSIS 5-2.5-18.5 LF-MCG/0.5 IM SUSY
0.5000 mL | PREFILLED_SYRINGE | Freq: Once | INTRAMUSCULAR | Status: AC
  Administered 2020-08-30: 0.5 mL via INTRAMUSCULAR

## 2020-08-30 NOTE — ED Triage Notes (Signed)
Patient states he cut his left thumb on a saw about 1 hour ago. Patient is unsure of his last tetanus shot.

## 2020-08-31 LAB — HM HIV SCREENING LAB: HM HIV Screening: NEGATIVE

## 2020-09-13 NOTE — Progress Notes (Signed)
[name] [address] [place] [address] Phone: [phone] Subjective:   I Christopher [name] am serving as a Neurosurgeon for Dr. Antoine [name].  This visit occurred during the SARS-CoV-2 public health emergency.  Safety protocols were in place, including screening questions prior to the visit, additional usage of staff PPE, and extensive cleaning of exam room while observing appropriate contact time as indicated for disinfecting solutions.   I'm seeing this patient by the request  of:  Patient, No Pcp Per (Inactive)  CC: Left shoulder, neck pain follow-up  SEG:[id number]  07/15/2020 Patient's left shoulder on MRI did not show any significant findings.  On ultrasound today patient does have a very mild subacromial bursitis and very mild effusion noted of the acromioclavicular joint.  Given a very low dose of prednisone.  Patient did respond well to the meloxicam previously but we will see how patient does with a 5-day burst of the prednisone.  Patient has muscle relaxers otherwise.  We discussed that it may be there could be some potential concern for the neck but I think it is highly unlikely.  Responding well to manipulation otherwise.  Follow-up with me again in 6 to 8 weeks  Update 09/14/2020 Christopher [name] is a 30 y.o. male coming in with complaint of back and neck pain. OMT 07/15/2020. Patient states this past weekend he felt pain. States that his left upper trap has been aching along with the shoulder. States he was having blood in his urine. States he believes it may have been the meloxicam. All other test were negative.   Medications patient has been prescribed: Prednisone  Patient was recently seen in the emergency department for laceration of the left thumb.     Patient has had an MRI of the shoulder that was unremarkable.  Reviewed prior external information including notes and imaging from previsou exam, outside providers and external EMR if  available.   As well as notes that were available from care everywhere and other healthcare systems.  Past medical history, social, surgical and family history all reviewed in electronic medical record.  No pertanent information unless stated regarding to the chief complaint.   No past medical history on file.  No Known Allergies   Review of Systems:  No headache, visual changes, nausea, vomiting, diarrhea, constipation, dizziness, abdominal pain, skin rash, fevers, chills, night sweats, weight loss, swollen lymph nodes, body aches, joint swelling, chest pain, shortness of breath, mood changes. POSITIVE muscle aches  Objective  Blood pressure 110/80, pulse 64, height 5\' 7"  (1.702 m), weight 144 lb (65.3 kg), SpO2 98 %.   General: No apparent distress alert and oriented x3 mood and affect normal, dressed appropriately.  HEENT: Pupils equal, extraocular movements intact  Respiratory: Patient's speak in full sentences and does not appear short of breath  Cardiovascular: No lower extremity edema, non tender, no erythema  Left thumb incision seems to be well-healing.  Patient has removed of the sutures already on his own. Left shoulder exam still has some mild impingement noted.  Full range of motion with 5 out of 5 strength of the rotator cuff. Neck exam still has significant tightness noted mostly on the left side of the neck.  Negative Spurling's.  Tightness in the left side of the parascapular region.  Low back exam does have tenderness noted around the right sacroiliac joint.  Negative straight leg test.  Tightness noted with .  Osteopathic findings  C2 flexed rotated and side  bent right T3 extended rotated and side bent right inhaled rib T9 extended rotated and side bent left L1 flexed rotated and side bent right Sacrum right on right     Assessment and Plan:  Left shoulder pain Continues to have left shoulder pain.  Has responded well to manipulation.  Patient does have  the muscle relaxer if needed.  We discussed with patient icing regimen and home exercises.  Increase activity follow-up with me again 6 to 8 weeks.  Discontinue the meloxicam with patient having a history of hematuria and started topical anti-inflammatories.   Nonallopathic problems  Decision today to treat with OMT was based on Physical Exam  After verbal consent patient was treated with HVLA, ME, FPR techniques in cervical, rib, thoracic, lumbar, and sacral  areas  Patient tolerated the procedure well with improvement in symptoms  Patient given exercises, stretches and lifestyle modifications  See medications in patient instructions if given  Patient will follow up in 4-8 weeks      The above documentation has been reviewed and is accurate and complete Christopher Saa, DO       Note: This dictation was prepared with Dragon dictation along with smaller phrase technology. Any transcriptional errors that result from this process are unintentional.

## 2020-09-14 ENCOUNTER — Other Ambulatory Visit: Payer: Self-pay

## 2020-09-14 ENCOUNTER — Encounter: Payer: Self-pay | Admitting: Family Medicine

## 2020-09-14 ENCOUNTER — Ambulatory Visit (INDEPENDENT_AMBULATORY_CARE_PROVIDER_SITE_OTHER): Payer: BC Managed Care – PPO | Admitting: Family Medicine

## 2020-09-14 VITALS — BP 110/80 | HR 64 | Ht 67.0 in | Wt 144.0 lb

## 2020-09-14 DIAGNOSIS — M9903 Segmental and somatic dysfunction of lumbar region: Secondary | ICD-10-CM

## 2020-09-14 DIAGNOSIS — M9908 Segmental and somatic dysfunction of rib cage: Secondary | ICD-10-CM

## 2020-09-14 DIAGNOSIS — M9904 Segmental and somatic dysfunction of sacral region: Secondary | ICD-10-CM

## 2020-09-14 DIAGNOSIS — M9901 Segmental and somatic dysfunction of cervical region: Secondary | ICD-10-CM

## 2020-09-14 DIAGNOSIS — M9902 Segmental and somatic dysfunction of thoracic region: Secondary | ICD-10-CM

## 2020-09-14 DIAGNOSIS — G8929 Other chronic pain: Secondary | ICD-10-CM

## 2020-09-14 DIAGNOSIS — M25512 Pain in left shoulder: Secondary | ICD-10-CM

## 2020-09-14 MED ORDER — DICLOFENAC SODIUM 2 % EX SOLN: 2.0000 g | Freq: Two times a day (BID) | CUTANEOUS | 3 refills | Status: DC

## 2020-09-14 NOTE — Assessment & Plan Note (Signed)
Continues to have left shoulder pain.  Has responded well to manipulation.  Patient does have the muscle relaxer if needed.  We discussed with patient icing regimen and home exercises.  Increase activity follow-up with me again 6 to 8 weeks.  Discontinue the meloxicam with patient having a history of hematuria and started topical anti-inflammatories.

## 2020-09-14 NOTE — Patient Instructions (Addendum)
Good to see you Pennsaid  Stop all oral antiinflammatories Continue exercises Keep shoulders under 90 degrees See me again in 6-8 weeks

## 2020-10-04 DIAGNOSIS — J3489 Other specified disorders of nose and nasal sinuses: Secondary | ICD-10-CM | POA: Diagnosis not present

## 2020-10-19 DIAGNOSIS — R310 Gross hematuria: Secondary | ICD-10-CM | POA: Diagnosis not present

## 2020-10-29 ENCOUNTER — Encounter: Payer: Self-pay | Admitting: Family Medicine

## 2020-10-29 ENCOUNTER — Ambulatory Visit: Payer: BC Managed Care – PPO | Admitting: Family Medicine

## 2020-10-29 ENCOUNTER — Other Ambulatory Visit: Payer: Self-pay

## 2020-10-29 VITALS — BP 114/80 | HR 59 | Ht 67.0 in | Wt 146.0 lb

## 2020-10-29 DIAGNOSIS — M9901 Segmental and somatic dysfunction of cervical region: Secondary | ICD-10-CM | POA: Diagnosis not present

## 2020-10-29 DIAGNOSIS — M9903 Segmental and somatic dysfunction of lumbar region: Secondary | ICD-10-CM | POA: Diagnosis not present

## 2020-10-29 DIAGNOSIS — M9904 Segmental and somatic dysfunction of sacral region: Secondary | ICD-10-CM

## 2020-10-29 DIAGNOSIS — G8929 Other chronic pain: Secondary | ICD-10-CM

## 2020-10-29 DIAGNOSIS — M9908 Segmental and somatic dysfunction of rib cage: Secondary | ICD-10-CM

## 2020-10-29 DIAGNOSIS — M25512 Pain in left shoulder: Secondary | ICD-10-CM | POA: Diagnosis not present

## 2020-10-29 DIAGNOSIS — M9902 Segmental and somatic dysfunction of thoracic region: Secondary | ICD-10-CM

## 2020-10-29 NOTE — Progress Notes (Signed)
  [name] [address] [place] [address] Phone: [phone] Subjective:   [name], am serving as a scribe for Dr. Antoine Primas.  This visit occurred during the SARS-CoV-2 public health emergency.  Safety protocols were in place, including screening questions prior to the visit, additional usage of staff PPE, and extensive cleaning of exam room while observing appropriate contact time as indicated for disinfecting solutions.   I'm seeing this patient by the request  of:  Patient, No Pcp Per (Inactive)  CC: Back and neck pain follow-up  FAO:[id number]  Christopher [name] is a 30 y.o. male coming in with complaint of back and neck pain. OMT 09/14/2020. Patient states that his back and shoulder have been doing well. Noticed popping today when winding up electrical cord.     No past medical history on file.  No Known Allergies   Review of Systems:  No headache, visual changes, nausea, vomiting, diarrhea, constipation, dizziness, abdominal pain, skin rash, fevers, chills, night sweats, weight loss, swollen lymph nodes, body aches, joint swelling, chest pain, shortness of breath, mood changes. POSITIVE muscle aches  Objective  Blood pressure 114/80, pulse (!) 59, height 5\' 7"  (1.702 m), weight 146 lb (66.2 kg), SpO2 99 %.   General: No apparent distress alert and oriented x3 mood and affect normal, dressed appropriately.  HEENT: Pupils equal, extraocular movements intact  Respiratory: Patient's speak in full sentences and does not appear short of breath  Cardiovascular: No lower extremity edema, non tender, no erythema  Mild tenderness over the right hip flexor.  Patient seems to be doing very well overall.  Patient does have some very mild tightness noted in the parascapular region.  Otherwise fairly unremarkable.  Osteopathic findings  C6 flexed rotated and side bent left T4 extended rotated and side bent left inhaled rib L4 flexed  rotated and side bent left Sacrum right on right       Assessment and Plan: Left shoulder pain Still seems to be scapular in nature and more muscular imbalances.  Discussed with patient icing regimen and home exercises.  She will take avoiding certain activities.  Follow-up again 8 weeks    Nonallopathic problems  Decision today to treat with OMT was based on Physical Exam  After verbal consent patient was treated with HVLA, ME, FPR techniques in cervical, rib, thoracic, lumbar, and sacral  areas  Patient tolerated the procedure well with improvement in symptoms  Patient given exercises, stretches and lifestyle modifications  See medications in patient instructions if given  Patient will follow up in 4-8 weeks     The above documentation has been reviewed and is accurate and complete , DO        Note: This dictation was prepared with Dragon dictation along with smaller phrase technology. Any transcriptional errors that result from this process are unintentional.

## 2020-10-29 NOTE — Patient Instructions (Signed)
[place] Enjoy R and R See me in 7-8 weeks

## 2020-10-29 NOTE — Assessment & Plan Note (Signed)
Still seems to be scapular in nature and more muscular imbalances.  Discussed with patient icing regimen and home exercises.  She will take avoiding certain activities.  Follow-up again 8 weeks

## 2020-11-25 ENCOUNTER — Ambulatory Visit: Payer: Self-pay | Admitting: Family Medicine

## 2020-11-25 ENCOUNTER — Other Ambulatory Visit: Payer: Self-pay

## 2020-11-25 ENCOUNTER — Encounter: Payer: Self-pay | Admitting: Family Medicine

## 2020-11-25 DIAGNOSIS — Z113 Encounter for screening for infections with a predominantly sexual mode of transmission: Secondary | ICD-10-CM

## 2020-11-25 LAB — GRAM STAIN
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
## 2020-11-30 LAB — GONOCOCCUS CULTURE

## 2020-12-02 ENCOUNTER — Telehealth: Payer: Self-pay

## 2020-12-02 DIAGNOSIS — N020 Recurrent and persistent hematuria with minor glomerular abnormality: Secondary | ICD-10-CM | POA: Diagnosis not present

## 2020-12-02 DIAGNOSIS — R82998 Other abnormal findings in urine: Secondary | ICD-10-CM | POA: Diagnosis not present

## 2020-12-02 NOTE — Addendum Note (Signed)
Addended by: Tracey Harries on: 12/02/2020 04:39 PM   Modules accepted: Orders

## 2020-12-02 NOTE — Telephone Encounter (Signed)
Phone call to patient. Counseled pt that state lab was not able to run RPR test with collection date of 11/25/20 due to labeling/paperwork issues.  We would like to bring him back in for lab only appt to recollect sample for testing.  Pt states he is not concerned about this particular test at this time and will call us when/if wants this to be recollected. States he has already missed too much time from work recently. Pt declines appt at this time.

## 2020-12-21 NOTE — Progress Notes (Signed)
  [name] [address] [place] [address] Phone: [phone] Subjective:   Christopher [name], am serving as a scribe for Dr. Antoine Primas. This visit occurred during the SARS-CoV-2 public health emergency.  Safety protocols were in place, including screening questions prior to the visit, additional usage of staff PPE, and extensive cleaning of exam room while observing appropriate contact time as indicated for disinfecting solutions.   I'm seeing this patient by the request  of:  Patient, No Pcp Per (Inactive)  CC: Neck and back pain follow-up  [id number]  Christopher [name] is a 30 y.o. male coming in with complaint of back and neck pain. OMT on 10/29/2020. Patient states back and neck pain are unchanged. Shoulder pain is getting progressively better. He is now able to sleep on left side and continues to work out. He does feel a bit of tightness when doing chest and shoulder exercises, but over all better.  Medications patient has been prescribed: None            No past medical history on file.  No Known Allergies   Review of Systems:  No headache, visual changes, nausea, vomiting, diarrhea, constipation, dizziness, abdominal pain, skin rash, fevers, chills, night sweats, weight loss, swollen lymph nodes, body aches, joint swelling, chest pain, shortness of breath, mood changes. POSITIVE muscle aches  Objective  Blood pressure 102/60, pulse 69, height 5\' 7"  (1.702 m), weight 144 lb (65.3 kg), SpO2 95 %.   General: No apparent distress alert and oriented x3 mood and affect normal, dressed appropriately.  HEENT: Pupils equal, extraocular movements intact  Respiratory: Patient's speak in full sentences and does not appear short of breath  Cardiovascular: No lower extremity edema, non tender, no erythema  Patient's left shoulder does have tightness noted in the parascapular region but overall seems to be doing better.  Osteopathic  findings  C2 flexed rotated and side bent right C6 flexed rotated and side bent left T3 extended rotated and side bent right inhaled rib T9 extended rotated and side bent left L2 flexed rotated and side bent right Sacrum right on right       Assessment and Plan:  Left shoulder pain Shoulder is doing better overall.  Discussed posture and ergonomics.  Discussed home exercises and icing regimen.  Increase activity slowly.  Follow-up with me again in 6 to 8 weeks  Hip flexor tightness, right Tightness still noted but overall doing relatively well.  Any breakthrough patient can take the anti-inflammatory.  Patient does have muscle relaxer as well.  Discussed icing regimen and home exercises.  Follow-up again in 6 to 8 weeks   Nonallopathic problems  Decision today to treat with OMT was based on Physical Exam  After verbal consent patient was treated with HVLA, ME, FPR techniques in cervical, rib, thoracic, lumbar, and sacral  areas  Patient tolerated the procedure well with improvement in symptoms  Patient given exercises, stretches and lifestyle modifications  See medications in patient instructions if given  Patient will follow up in 8-12 weeks     The above documentation has been reviewed and is accurate and complete 10-12, DO         Note: This dictation was prepared with Dragon dictation along with smaller phrase technology. Any transcriptional errors that result from this process are unintentional.

## 2020-12-22 ENCOUNTER — Ambulatory Visit: Payer: BC Managed Care – PPO | Admitting: Family Medicine

## 2020-12-22 ENCOUNTER — Other Ambulatory Visit: Payer: Self-pay

## 2020-12-22 VITALS — BP 102/60 | HR 69 | Ht 67.0 in | Wt 144.0 lb

## 2020-12-22 DIAGNOSIS — M9904 Segmental and somatic dysfunction of sacral region: Secondary | ICD-10-CM | POA: Diagnosis not present

## 2020-12-22 DIAGNOSIS — M9901 Segmental and somatic dysfunction of cervical region: Secondary | ICD-10-CM

## 2020-12-22 DIAGNOSIS — M24551 Contracture, right hip: Secondary | ICD-10-CM

## 2020-12-22 DIAGNOSIS — M9908 Segmental and somatic dysfunction of rib cage: Secondary | ICD-10-CM

## 2020-12-22 DIAGNOSIS — M25512 Pain in left shoulder: Secondary | ICD-10-CM

## 2020-12-22 DIAGNOSIS — M9903 Segmental and somatic dysfunction of lumbar region: Secondary | ICD-10-CM | POA: Diagnosis not present

## 2020-12-22 DIAGNOSIS — G8929 Other chronic pain: Secondary | ICD-10-CM

## 2020-12-22 DIAGNOSIS — M9902 Segmental and somatic dysfunction of thoracic region: Secondary | ICD-10-CM

## 2020-12-22 NOTE — Assessment & Plan Note (Signed)
Shoulder is doing better overall.  Discussed posture and ergonomics.  Discussed home exercises and icing regimen.  Increase activity slowly.  Follow-up with me again in 6 to 8 weeks

## 2020-12-22 NOTE — Assessment & Plan Note (Signed)
Tightness still noted but overall doing relatively well.  Any breakthrough patient can take the anti-inflammatory.  Patient does have muscle relaxer as well.  Discussed icing regimen and home exercises.  Follow-up again in 6 to 8 weeks

## 2020-12-22 NOTE — Patient Instructions (Signed)
See me in 2-3 months Enjoy the new bike

## 2021-03-09 NOTE — Progress Notes (Signed)
°  [name] [address] [place] [address] Phone: [phone] Subjective:    I'm seeing this patient by the request  of:  Patient, No Pcp Per (Inactive)  CC: back and neck pain   [id number]  VALIN MASSIE is a 30 y.o. male coming in with complaint of back and neck pain. OMT on 12/22/2020. Patient states shoulder is feeling better and has felt some in lower back. No other complaints.  Patient overall has been doing relatively well.  He has been having a little bit more stress that he thinks is contributing to some of the neck pain.  Medications patient has been prescribed: None  Taking:         Reviewed prior external information including notes and imaging from previsou exam, outside providers and external EMR if available.   As well as notes that were available from care everywhere and other healthcare systems.  Past medical history, social, surgical and family history all reviewed in electronic medical record.  No pertanent information unless stated regarding to the chief complaint.   No past medical history on file.  No Known Allergies   Review of Systems:  No headache, visual changes, nausea, vomiting, diarrhea, constipation, dizziness, abdominal pain, skin rash, fevers, chills, night sweats, weight loss, swollen lymph nodes, body aches, joint swelling, chest pain, shortness of breath, mood changes. POSITIVE muscle aches  Objective  Blood pressure 116/72, pulse 80, height 5\' 7"  (1.702 m), weight 148 lb (67.1 kg), SpO2 97 %.   General: No apparent distress alert and oriented x3 mood and affect normal, dressed appropriately.  HEENT: Pupils equal, extraocular movements intact  Respiratory: Patient's speak in full sentences and does not appear short of breath  Cardiovascular: No lower extremity edema, non tender, no erythema  Tightness noted more of a parascapular region.  Patient does have some tightness of the neck on the right  side as well.  Negative Spurling's.  5-5 strength of all the upper extremities.  No significant impingement of the left shoulder on exam patient does have still tightness noted of the hip flexors.  Osteopathic findings  C2 flexed rotated and side bent right C6 flexed rotated and side bent left T3 extended rotated and side bent left inhaled rib L5 flexed rotated and side bent left Sacrum right on right       Assessment and Plan:  Hip flexor tightness, right Tightness still noted.  Still some mild muscle imbalances.  Patient does working out on a more regular basis.  Discussed icing regimen and home exercises, discussed which activities to do which wants to avoid.  Increase activity slowly.  Follow-up again 6 to 8 weeks    Nonallopathic problems  Decision today to treat with OMT was based on Physical Exam  After verbal consent patient was treated with HVLA, ME, FPR techniques in cervical, rib, thoracic, lumbar, and sacral  areas  Patient tolerated the procedure well with improvement in symptoms  Patient given exercises, stretches and lifestyle modifications  See medications in patient instructions if given  Patient will follow up in 4-8 weeks      The above documentation has been reviewed and is accurate and complete , DO       Note: This dictation was prepared with Dragon dictation along with smaller phrase technology. Any transcriptional errors that result from this process are unintentional.

## 2021-03-10 ENCOUNTER — Other Ambulatory Visit: Payer: Self-pay

## 2021-03-10 ENCOUNTER — Ambulatory Visit (INDEPENDENT_AMBULATORY_CARE_PROVIDER_SITE_OTHER): Payer: BC Managed Care – PPO | Admitting: Family Medicine

## 2021-03-10 VITALS — BP 116/72 | HR 80 | Ht 67.0 in | Wt 148.0 lb

## 2021-03-10 DIAGNOSIS — M9904 Segmental and somatic dysfunction of sacral region: Secondary | ICD-10-CM

## 2021-03-10 DIAGNOSIS — M9903 Segmental and somatic dysfunction of lumbar region: Secondary | ICD-10-CM

## 2021-03-10 DIAGNOSIS — M9908 Segmental and somatic dysfunction of rib cage: Secondary | ICD-10-CM

## 2021-03-10 DIAGNOSIS — M24551 Contracture, right hip: Secondary | ICD-10-CM

## 2021-03-10 DIAGNOSIS — M9901 Segmental and somatic dysfunction of cervical region: Secondary | ICD-10-CM

## 2021-03-10 DIAGNOSIS — M9902 Segmental and somatic dysfunction of thoracic region: Secondary | ICD-10-CM

## 2021-03-10 NOTE — Patient Instructions (Signed)
Good to see you! Stay upright if you're boarding See you again in 2-3 months

## 2021-03-11 NOTE — Assessment & Plan Note (Signed)
Tightness still noted.  Still some mild muscle imbalances.  Patient does working out on a more regular basis.  Discussed icing regimen and home exercises, discussed which activities to do which wants to avoid.  Increase activity slowly.  Follow-up again 6 to 8 weeks

## 2021-05-17 DIAGNOSIS — R310 Gross hematuria: Secondary | ICD-10-CM | POA: Diagnosis not present
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
## 2021-06-03 ENCOUNTER — Ambulatory Visit (INDEPENDENT_AMBULATORY_CARE_PROVIDER_SITE_OTHER): Payer: BC Managed Care – PPO | Admitting: Family Medicine

## 2021-06-03 ENCOUNTER — Other Ambulatory Visit: Payer: Self-pay

## 2021-06-03 ENCOUNTER — Ambulatory Visit: Payer: Self-pay

## 2021-06-03 ENCOUNTER — Encounter: Payer: Self-pay | Admitting: Family Medicine

## 2021-06-03 VITALS — BP 124/82 | HR 62 | Ht 67.0 in | Wt 130.0 lb

## 2021-06-03 DIAGNOSIS — M25532 Pain in left wrist: Secondary | ICD-10-CM

## 2021-06-03 DIAGNOSIS — M9908 Segmental and somatic dysfunction of rib cage: Secondary | ICD-10-CM

## 2021-06-03 DIAGNOSIS — M9903 Segmental and somatic dysfunction of lumbar region: Secondary | ICD-10-CM | POA: Diagnosis not present

## 2021-06-03 DIAGNOSIS — M9901 Segmental and somatic dysfunction of cervical region: Secondary | ICD-10-CM

## 2021-06-03 DIAGNOSIS — M9904 Segmental and somatic dysfunction of sacral region: Secondary | ICD-10-CM

## 2021-06-03 DIAGNOSIS — M9902 Segmental and somatic dysfunction of thoracic region: Secondary | ICD-10-CM

## 2021-06-03 DIAGNOSIS — M24551 Contracture, right hip: Secondary | ICD-10-CM | POA: Diagnosis not present

## 2021-06-03 NOTE — Patient Instructions (Addendum)
?  Squeeze if any trouble from wrist ?Ok to start lifting from neutral position ?See me in 2-3 months ?

## 2021-06-04 DIAGNOSIS — M25532 Pain in left wrist: Secondary | ICD-10-CM | POA: Insufficient documentation

## 2021-06-04 NOTE — Assessment & Plan Note (Signed)
Left wrist does have some subluxation noted in the lunate bone. ?The patient has some improvement in the pain immediately.  Discussed which activities to do which wants to avoid, increase activity slowly.  Could do some potential taping but I do not think bracing is necessary.  Discussed about lifting in neutral position.  Follow-up again in 6 to 8 weeks ?

## 2021-06-04 NOTE — Assessment & Plan Note (Signed)
Continue tightness noted at this time.  Discussed icing regimen and home exercises.  We have discussed that this chronic problem with exacerbation the patient has the Zanaflex as needed.  Tries to avoid it when possible.  Increase activity slowly otherwise.  Follow-up again in 6 to 8 weeks ?

## 2021-08-09 DIAGNOSIS — F4389 Other reactions to severe stress: Secondary | ICD-10-CM | POA: Diagnosis not present

## 2021-08-09 DIAGNOSIS — F419 Anxiety disorder, unspecified: Secondary | ICD-10-CM | POA: Diagnosis not present

## 2021-08-16 DIAGNOSIS — F4389 Other reactions to severe stress: Secondary | ICD-10-CM | POA: Diagnosis not present

## 2021-08-16 DIAGNOSIS — F419 Anxiety disorder, unspecified: Secondary | ICD-10-CM | POA: Diagnosis not present

## 2021-08-25 NOTE — Progress Notes (Unsigned)
  [name] [address] [place] [address] Phone: [phone] Subjective:   Christopher [name], am serving as a scribe for Dr. Antoine Primas.   I'm seeing this patient by the request  of:  Patient, No Pcp Per (Inactive)  CC: Back and neck pain follow-up  [id number]  Christopher [name] is a 31 y.o. male coming in with complaint of back and neck pain. OMT on 06/03/2021. Patient states that he is about 90%. Little back or shoulder pain.  Patient states can do all daily activities without any significant difficulty.  Medications patient has been prescribed: None  Taking:         Reviewed prior external information including notes and imaging from previsou exam, outside providers and external EMR if available.   As well as notes that were available from care everywhere and other healthcare systems.  Past medical history, social, surgical and family history all reviewed in electronic medical record.  No pertanent information unless stated regarding to the chief complaint.     Review of Systems:  No headache, visual changes, nausea, vomiting, diarrhea, constipation, dizziness, abdominal pain, skin rash, fevers, chills, night sweats, weight loss, swollen lymph nodes, body aches, joint swelling, chest pain, shortness of breath, mood changes. POSITIVE muscle aches  Objective  Blood pressure 110/72, pulse 81, height 5\' 8"  (1.727 m), SpO2 96 %.   General: No apparent distress alert and oriented x3 mood and affect normal, dressed appropriately.  HEENT: Pupils equal, extraocular movements intact  Respiratory: Patient's speak in full sentences and does not appear short of breath  Cardiovascular: No lower extremity edema, non tender, no erythema  Low back does have loss of lordosis  Tightness with FABER  Negative straight leg test.  Still has some tightness around the right paraspinal musculature of the scapula.   Osteopathic findings  C3 flexed  rotated and side bent right C6 flexed rotated and side bent right  T3 extended rotated and side bent right inhaled rib T8 extended rotated and side bent left L2 flexed rotated and side bent left  Sacrum right on right     Assessment and Plan:  Hip flexor tightness, right Tightness still noted, still doing well  OMT responding well has different muscle relaxers for breakthrough.  Discussed again about the Zanaflex.  Did not need any refills.  Follow-up with me again in 6 to 8 weeks otherwise.    Nonallopathic problems  Decision today to treat with OMT was based on Physical Exam  After verbal consent patient was treated with HVLA, ME, FPR techniques in cervical, rib, thoracic, lumbar, and sacral  areas  Patient tolerated the procedure well with improvement in symptoms  Patient given exercises, stretches and lifestyle modifications  See medications in patient instructions if given  Patient will follow up in 4-8 weeks     The above documentation has been reviewed and is accurate and complete , DO        Note: This dictation was prepared with Dragon dictation along with smaller phrase technology. Any transcriptional errors that result from this process are unintentional.

## 2021-08-26 ENCOUNTER — Encounter: Payer: Self-pay | Admitting: Family Medicine

## 2021-08-26 ENCOUNTER — Ambulatory Visit (INDEPENDENT_AMBULATORY_CARE_PROVIDER_SITE_OTHER): Payer: BC Managed Care – PPO | Admitting: Family Medicine

## 2021-08-26 VITALS — BP 110/72 | HR 81 | Ht 68.0 in

## 2021-08-26 DIAGNOSIS — M9903 Segmental and somatic dysfunction of lumbar region: Secondary | ICD-10-CM | POA: Diagnosis not present

## 2021-08-26 DIAGNOSIS — M24551 Contracture, right hip: Secondary | ICD-10-CM | POA: Diagnosis not present

## 2021-08-26 DIAGNOSIS — M9908 Segmental and somatic dysfunction of rib cage: Secondary | ICD-10-CM

## 2021-08-26 DIAGNOSIS — M9901 Segmental and somatic dysfunction of cervical region: Secondary | ICD-10-CM | POA: Diagnosis not present

## 2021-08-26 DIAGNOSIS — M9904 Segmental and somatic dysfunction of sacral region: Secondary | ICD-10-CM | POA: Diagnosis not present

## 2021-08-26 DIAGNOSIS — M9902 Segmental and somatic dysfunction of thoracic region: Secondary | ICD-10-CM

## 2021-08-26 NOTE — Patient Instructions (Signed)
Great to see you  You are doing awesome See me again in 3-4 months

## 2021-08-26 NOTE — Assessment & Plan Note (Signed)
Tightness still noted, still doing well  OMT responding well has different muscle relaxers for breakthrough.  Discussed again about the Zanaflex.  Did not need any refills.  Follow-up with me again in 6 to 8 weeks otherwise.

## 2021-09-06 ENCOUNTER — Ambulatory Visit: Payer: Self-pay | Admitting: Family Medicine

## 2021-09-06 ENCOUNTER — Encounter: Payer: Self-pay | Admitting: Family Medicine

## 2021-09-06 DIAGNOSIS — B977 Papillomavirus as the cause of diseases classified elsewhere: Secondary | ICD-10-CM

## 2021-09-06 DIAGNOSIS — Z113 Encounter for screening for infections with a predominantly sexual mode of transmission: Secondary | ICD-10-CM

## 2021-09-06 LAB — HM HIV SCREENING LAB: HM HIV Screening: NEGATIVE

## 2021-09-06 NOTE — Progress Notes (Signed)
[facility] Department STI clinic/screening visit  Subjective:  Christopher [name] is a 31 y.o. male being seen today for an STI screening visit. The patient reports they do not have symptoms.    Patient has the following medical conditions:   Patient Active Problem List   Diagnosis Date Noted   Left wrist pain 06/04/2021   Vitamin D deficiency 01/20/2020   Left shoulder pain 06/24/2019   Hip flexor tightness, right 05/23/2019   Polyarthralgia 05/23/2019   Whiplash injuries, initial encounter 04/22/2019   Left lateral knee pain 04/22/2019   Nonallopathic lesion of cervical region 04/22/2019   Nonallopathic lesion of thoracic region 04/22/2019   Nonallopathic lesion of rib cage 04/22/2019   Nonallopathic lesion of lumbosacral region 04/22/2019   Nonallopathic lesion of sacral region 04/22/2019   Insect bite (nonvenomous) of penis, initial encounter 07/22/2017   Atopic dermatitis 07/22/2017     Chief Complaint  Patient presents with   SEXUALLY TRANSMITTED DISEASE    STI screening    HPI  Patient reports here for screening, denies s/sx   Does the patient or their partner desires a pregnancy in the next year? No  Screening for MPX risk: Does the patient have an unexplained rash? No Is the patient MSM? No Does the patient endorse multiple sex partners or anonymous sex partners? No Did the patient have close or sexual contact with a person diagnosed with MPX? No Has the patient traveled outside the [place] where MPX is endemic? No Is there a high clinical suspicion for MPX-- evidenced by one of the following No  -Unlikely to be chickenpox  -Lymphadenopathy  -Rash that present in same phase of evolution on any given body part   See flowsheet for further details and programmatic requirements.   Immunization History  Administered Date(s) Administered   HPV 9-valent 08/15/2016, 10/18/2016, 02/21/2017   Tdap 08/30/2020     The following portions of the patient's  history were reviewed and updated as appropriate: allergies, current medications, past medical history, past social history, past surgical history and problem list.  Objective:  There were no vitals filed for this visit.  Physical Exam Constitutional:      Appearance: Normal appearance.  HENT:     Head: Normocephalic.     Mouth/Throat:     Mouth: Mucous membranes are moist.     Pharynx: Oropharynx is clear. No oropharyngeal exudate.  Genitourinary:    Penis: Normal.      Testes: Normal.     Comments: No lice, nits, or pest, no lesions or odor discharge.  Denies pain or tenderness with paplation of testicles.  No lesions, ulcers or masses present.  <1 mm wart on base of penis   Musculoskeletal:     Cervical back: Normal range of motion.  Lymphadenopathy:     Cervical: No cervical adenopathy.  Skin:    General: Skin is warm and dry.     Findings: No bruising, erythema, lesion or rash.  Neurological:     Mental Status: He is alert.  Psychiatric:        Mood and Affect: Mood normal.        Behavior: Behavior normal.       Assessment and Plan:  Christopher [name] is a 31 y.o. male presenting to the [facility] Department for STI screening  1. Screening examination for venereal disease Patient does not have STI symptoms Patient accepted all screenings including  gram stain, urethral GC and bloodwork for HIV/RPR.  Patient meets criteria for HepB screening? Yes. Ordered? No - declined Patient meets criteria for HepC screening? Yes. Ordered? No - declined  Recommended condom use with all sex Discussed importance of condom use for STI prevent  Treat gram stain per standing order Discussed time line for [facility] results and that patient will be called with positive results and encouraged patient to call if he had not heard in 2 weeks Recommended returning for continued or worsening symptoms.   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  Patient tolerated procedure well. -Reviewed with patient after-care instructions and when to call clinic. -No sex until area has completely healed. -Rec condoms with all sex. -RTC in 10-14 days for next treatment if needed.      No follow-ups on file.  Future Appointments  Date Time Provider Department Center  12/02/2021  3:00 PM Judi Saa, DO [facility] None    Wendi Snipes, FNP

## 2021-09-06 NOTE — Progress Notes (Signed)
GRAM STAIN negative. No medication needed per provider. Pt verbalized understanding of further pending lab tests. Condoms declined. Keitha Butte RN

## 2021-09-07 LAB — GRAM STAIN

## 2021-09-10 LAB — GONOCOCCUS CULTURE

## 2021-09-14 DIAGNOSIS — F419 Anxiety disorder, unspecified: Secondary | ICD-10-CM | POA: Diagnosis not present

## 2021-09-14 DIAGNOSIS — F4389 Other reactions to severe stress: Secondary | ICD-10-CM | POA: Diagnosis not present

## 2021-09-20 DIAGNOSIS — F4389 Other reactions to severe stress: Secondary | ICD-10-CM | POA: Diagnosis not present

## 2021-09-20 DIAGNOSIS — F419 Anxiety disorder, unspecified: Secondary | ICD-10-CM | POA: Diagnosis not present

## 2021-10-11 DIAGNOSIS — F4389 Other reactions to severe stress: Secondary | ICD-10-CM | POA: Diagnosis not present

## 2021-10-11 DIAGNOSIS — F419 Anxiety disorder, unspecified: Secondary | ICD-10-CM | POA: Diagnosis not present

## 2021-10-24 DIAGNOSIS — F419 Anxiety disorder, unspecified: Secondary | ICD-10-CM | POA: Diagnosis not present

## 2021-10-24 DIAGNOSIS — F4389 Other reactions to severe stress: Secondary | ICD-10-CM | POA: Diagnosis not present

## 2021-11-16 DIAGNOSIS — F4389 Other reactions to severe stress: Secondary | ICD-10-CM | POA: Diagnosis not present

## 2021-11-16 DIAGNOSIS — F419 Anxiety disorder, unspecified: Secondary | ICD-10-CM | POA: Diagnosis not present

## 2021-11-29 NOTE — Progress Notes (Signed)
[name] [address] [place] [address] Phone: [phone] Subjective:   [name], am serving as a scribe for Dr. Antoine Primas.  I'm seeing this patient by the request  of:  Patient, No Pcp Per  CC: back and neck pain   [id number]  Christopher [name] is a 31 y.o. male coming in with complaint of back and neck pain. OMT on 08/26/2021. Patient states shoulder area is tight. Hyperextended his left thumb about 3 weeks ago. No other issues.   Medications patient has been prescribed: None       Reviewed prior external information including notes and imaging from previsou exam, outside providers and external EMR if available.   As well as notes that were available from care everywhere and other healthcare systems.  Past medical history, social, surgical and family history all reviewed in electronic medical record.  No pertanent information unless stated regarding to the chief complaint.   No past medical history on file.  No Known Allergies   Review of Systems:  No headache, visual changes, nausea, vomiting, diarrhea, constipation, dizziness, abdominal pain, skin rash, fevers, chills, night sweats, weight loss, swollen lymph nodes, body aches, joint swelling, chest pain, shortness of breath, mood changes. POSITIVE muscle aches  Objective  Blood pressure 120/78, pulse 77, height 5\' 8"  (1.727 m), weight 148 lb (67.1 kg), SpO2 98 %.   General: No apparent distress alert and oriented x3 mood and affect normal, dressed appropriately.  HEENT: Pupils equal, extraocular movements intact  Respiratory: Patient's speak in full sentences and does not appear short of breath  Cardiovascular: No lower extremity edema, non tender, no erythema  MSK:  Back back exam does have some mild loss of lordosis.  Tightness noted in the thoracolumbar juncture.  Some mild decrease in certain range of motion. Left hand exam shows the patient does have tenderness  to palpation over the thumb itself and mostly the MP joint.  Patient does have positive gapping on testing of the UCL.  Limited muscular skeletal ultrasound was performed and interpreted by , M  Limited ultrasound of patient's left thumb shows some hypoechoic changes in the MP joint.  No significant cortical irregularities are noted.  Patient does have gapping noted on dynamic testing. Impression: UCL injury  Osteopathic findings  C7 flexed rotated and side bent left T4 extended rotated and side bent right inhaled rib T7 extended rotated and side bent left L2 flexed rotated and side bent right Sacrum right on right    Assessment and Plan:  Rupture of UCL of left thumb I believe the patient does have an injury unfortunately noted of the UCL.  Difficult to test if it is full-thickness.  We discussed potential MRI.  Patient wanted to try conservative therapy.  We will do a thumb spica splint but to not remove it is much as possible.  Discussed with patient that this will likely be necessary for 4 to 6 weeks.  Patient will follow-up at that time and depending on how patient responds we will decide if it is advanced imaging is needed at that point.    Nonallopathic problems  Decision today to treat with OMT was based on Physical Exam  After verbal consent patient was treated with HVLA, ME, FPR techniques in cervical, rib, thoracic, lumbar, and sacral  areas  Patient tolerated the procedure well with improvement in symptoms  Patient given exercises, stretches and lifestyle modifications  See medications in patient instructions  if given  Patient will follow up in 4-8 weeks    The above documentation has been reviewed and is accurate and complete Lyndal Pulley, DO          Note: This dictation was prepared with Dragon dictation along with smaller phrase technology. Any transcriptional errors that result from this process are unintentional.

## 2021-12-02 ENCOUNTER — Ambulatory Visit (INDEPENDENT_AMBULATORY_CARE_PROVIDER_SITE_OTHER): Payer: BC Managed Care – PPO | Admitting: Family Medicine

## 2021-12-02 ENCOUNTER — Ambulatory Visit: Payer: Self-pay

## 2021-12-02 VITALS — BP 120/78 | HR 77 | Ht 68.0 in | Wt 148.0 lb

## 2021-12-02 DIAGNOSIS — M9901 Segmental and somatic dysfunction of cervical region: Secondary | ICD-10-CM

## 2021-12-02 DIAGNOSIS — M9908 Segmental and somatic dysfunction of rib cage: Secondary | ICD-10-CM | POA: Diagnosis not present

## 2021-12-02 DIAGNOSIS — S63642A Sprain of metacarpophalangeal joint of left thumb, initial encounter: Secondary | ICD-10-CM

## 2021-12-02 DIAGNOSIS — M24551 Contracture, right hip: Secondary | ICD-10-CM | POA: Diagnosis not present

## 2021-12-02 DIAGNOSIS — M9903 Segmental and somatic dysfunction of lumbar region: Secondary | ICD-10-CM

## 2021-12-02 DIAGNOSIS — M79645 Pain in left finger(s): Secondary | ICD-10-CM

## 2021-12-02 DIAGNOSIS — M9904 Segmental and somatic dysfunction of sacral region: Secondary | ICD-10-CM

## 2021-12-02 DIAGNOSIS — M9902 Segmental and somatic dysfunction of thoracic region: Secondary | ICD-10-CM

## 2021-12-02 NOTE — Assessment & Plan Note (Signed)
I believe the patient does have an injury unfortunately noted of the UCL.  Difficult to test if it is full-thickness.  We discussed potential MRI.  Patient wanted to try conservative therapy.  We will do a thumb spica splint but to not remove it is much as possible.  Discussed with patient that this will likely be necessary for 4 to 6 weeks.  Patient will follow-up at that time and depending on how patient responds we will decide if it is advanced imaging is needed at that point.

## 2021-12-02 NOTE — Assessment & Plan Note (Signed)
Tightness still noted on the right side.  Responded extremely well to osteopathic manipulation.  Only takes over-the-counter medications when needed.  Follow-up with me again in 6 to 8 weeks

## 2021-12-02 NOTE — Patient Instructions (Signed)
Brace as much as possible for next 4-6 weeks

## 2021-12-13 DIAGNOSIS — F4389 Other reactions to severe stress: Secondary | ICD-10-CM | POA: Diagnosis not present

## 2021-12-13 DIAGNOSIS — F419 Anxiety disorder, unspecified: Secondary | ICD-10-CM | POA: Diagnosis not present

## 2022-01-02 DIAGNOSIS — R310 Gross hematuria: Secondary | ICD-10-CM | POA: Diagnosis not present

## 2022-01-02 DIAGNOSIS — N020 Recurrent and persistent hematuria with minor glomerular abnormality: Secondary | ICD-10-CM | POA: Diagnosis not present

## 2022-01-02 DIAGNOSIS — R82998 Other abnormal findings in urine: Secondary | ICD-10-CM | POA: Diagnosis not present

## 2022-01-10 DIAGNOSIS — R310 Gross hematuria: Secondary | ICD-10-CM | POA: Diagnosis not present

## 2022-01-10 DIAGNOSIS — R809 Proteinuria, unspecified: Secondary | ICD-10-CM | POA: Diagnosis not present

## 2022-01-11 NOTE — Progress Notes (Signed)
  [facility] Phone: [phone] Subjective:   Fontaine No, am serving as a scribe for Dr. Hulan Saas.  I'm seeing this patient by the request  of:  Patient, No Pcp Per  CC: back and neck pain and thumb pain   [id number]  Christopher [name] is a 31 y.o. male coming in with complaint of back and neck pain. OMT on 12/02/2021. Also seen for thumb pain. Patient states that he is doing better. Overall, opposition is not as painful. About 80% better.   Back and neck pain have improved.   Medications patient has been prescribed: None  Taking:         Reviewed prior external information including notes and imaging from previsou exam, outside providers and external EMR if available.   As well as notes that were available from care everywhere and other healthcare systems.  Past medical history, social, surgical and family history all reviewed in electronic medical record.  No pertanent information unless stated regarding to the chief complaint.   No past medical history on file.  No Known Allergies   Review of Systems:  No headache, visual changes, nausea, vomiting, diarrhea, constipation, dizziness, abdominal pain, skin rash, fevers, chills, night sweats, weight loss, swollen lymph nodes, body aches, joint swelling, chest pain, shortness of breath, mood changes. POSITIVE muscle aches  Objective  Blood pressure 118/72, pulse (!) 55, height 5\' 8"  (1.727 m), weight 149 lb (67.6 kg), SpO2 99 %.   General: No apparent distress alert and oriented x3 mood and affect normal, dressed appropriately.  HEENT: Pupils equal, extraocular movements intact  Respiratory: Patient's speak in full sentences and does not appear short of breath  Cardiovascular: No lower extremity edema, non tender, no erythema  Left thumb exam shows that patient UCL does have significant strength noted compared to previous exam.  Good endpoint  noted.  Osteopathic findings  C5 flexed rotated and side bent right T3 extended rotated and side bent right inhaled rib L2 flexed rotated and side bent right Sacrum right on right   Limited muscular skeletal ultrasound was performed and interpreted by Hulan Saas, M  Limited ultrasound of patient's ACL seems to have significant decrease in hypoechoic changes from previous exam.  Patient does have no gapping on dynamic testing.    Assessment and Plan:  Rupture of UCL of left thumb Patient is remarkably healed extremely well.  Discussed with patient about icing regimen and home exercises, which activities to do and which ones to avoid.  Increase activity slowly otherwise.  Follow-up with me again in 6 to 8 weeks    Nonallopathic problems  Decision today to treat with OMT was based on Physical Exam  After verbal consent patient was treated with HVLA, ME, FPR techniques in cervical, rib, thoracic, lumbar, and sacral  areas  Patient tolerated the procedure well with improvement in symptoms  Patient given exercises, stretches and lifestyle modifications  See medications in patient instructions if given  Patient will follow up in 4-8 weeks     The above documentation has been reviewed and is accurate and complete Lyndal Pulley, DO         Note: This dictation was prepared with Dragon dictation along with smaller phrase technology. Any transcriptional errors that result from this process are unintentional.

## 2022-01-13 ENCOUNTER — Ambulatory Visit (INDEPENDENT_AMBULATORY_CARE_PROVIDER_SITE_OTHER): Payer: BC Managed Care – PPO | Admitting: Family Medicine

## 2022-01-13 ENCOUNTER — Ambulatory Visit: Payer: Self-pay

## 2022-01-13 ENCOUNTER — Encounter: Payer: Self-pay | Admitting: Family Medicine

## 2022-01-13 VITALS — BP 118/72 | HR 55 | Ht 68.0 in | Wt 149.0 lb

## 2022-01-13 DIAGNOSIS — M9908 Segmental and somatic dysfunction of rib cage: Secondary | ICD-10-CM

## 2022-01-13 DIAGNOSIS — S63642A Sprain of metacarpophalangeal joint of left thumb, initial encounter: Secondary | ICD-10-CM | POA: Diagnosis not present

## 2022-01-13 DIAGNOSIS — M9901 Segmental and somatic dysfunction of cervical region: Secondary | ICD-10-CM | POA: Diagnosis not present

## 2022-01-13 DIAGNOSIS — M24551 Contracture, right hip: Secondary | ICD-10-CM

## 2022-01-13 DIAGNOSIS — M9902 Segmental and somatic dysfunction of thoracic region: Secondary | ICD-10-CM

## 2022-01-13 DIAGNOSIS — M9904 Segmental and somatic dysfunction of sacral region: Secondary | ICD-10-CM

## 2022-01-13 DIAGNOSIS — M9903 Segmental and somatic dysfunction of lumbar region: Secondary | ICD-10-CM

## 2022-01-13 DIAGNOSIS — M79645 Pain in left finger(s): Secondary | ICD-10-CM

## 2022-01-13 NOTE — Assessment & Plan Note (Signed)
Patient is remarkably healed extremely well.  Discussed with patient about icing regimen and home exercises, which activities to do and which ones to avoid.  Increase activity slowly otherwise.  Follow-up with me again in 6 to 8 weeks

## 2022-01-13 NOTE — Patient Instructions (Signed)
Thumb looks great Be careful for next 10 days Wear other brace with golf See me again in 3 months

## 2022-01-13 NOTE — Assessment & Plan Note (Signed)
Chronic problem but stable overall.  Discussed with patient's that I do think he is doing relatively well.  No change in medicine but does have the Zanaflex 4 mg to take at bedtime if needed.  Does have the meloxicam 7.5 mg as well and knows to take in a burst if necessary.  Follow-up with me again 12 weeks

## 2022-01-16 DIAGNOSIS — R809 Proteinuria, unspecified: Secondary | ICD-10-CM | POA: Diagnosis not present

## 2022-01-16 DIAGNOSIS — R310 Gross hematuria: Secondary | ICD-10-CM | POA: Diagnosis not present

## 2022-01-26 DIAGNOSIS — F4389 Other reactions to severe stress: Secondary | ICD-10-CM | POA: Diagnosis not present

## 2022-01-26 DIAGNOSIS — F419 Anxiety disorder, unspecified: Secondary | ICD-10-CM | POA: Diagnosis not present

## 2022-02-15 ENCOUNTER — Ambulatory Visit (INDEPENDENT_AMBULATORY_CARE_PROVIDER_SITE_OTHER): Payer: BC Managed Care – PPO | Admitting: Urology

## 2022-02-15 VITALS — BP 113/77 | HR 54 | Ht 68.0 in | Wt 149.0 lb

## 2022-02-15 DIAGNOSIS — R361 Hematospermia: Secondary | ICD-10-CM

## 2022-02-15 DIAGNOSIS — R310 Gross hematuria: Secondary | ICD-10-CM | POA: Diagnosis not present

## 2022-02-15 LAB — URINALYSIS, COMPLETE
Bilirubin, UA: NEGATIVE
Glucose, UA: NEGATIVE
Ketones, UA: NEGATIVE
Leukocytes,UA: NEGATIVE
Nitrite, UA: NEGATIVE
Protein,UA: NEGATIVE
RBC, UA: NEGATIVE
Specific Gravity, UA: 1.025 (ref 1.005–1.030)
Urobilinogen, Ur: 0.2 mg/dL (ref 0.2–1.0)
pH, UA: 5 (ref 5.0–7.5)

## 2022-02-15 LAB — MICROSCOPIC EXAMINATION

## 2022-02-15 LAB — BLADDER SCAN AMB NON-IMAGING: Scan Result: 0

## 2022-02-15 NOTE — Progress Notes (Signed)
02/15/2022 11:55 AM   Christopher [name] 12/28/1990 [id number]  Referring provider: Mosetta Pigeon, MD [address] [facility] [address] [place],  [place] [address]  Chief Complaint  Patient presents with   Hematuria    HPI: 31 year old male who presents today for further evaluation of gross hematuria.  Notably, he is an established patient of Dr. Sheppard Penton and is previously undergone hematuria evaluation including noncontrast CT scan (with and w/o contrast abdomen pelvis 06/2020) and cystoscopy in 2022.  We do not have records of this but will obtain them today.  He was seen and evaluated by nephrology was seen and evaluated by nephrology for the same issue along with proteinuria.  Creatinine is normal.  Additional labs creatinine 0.89, albumin 4.5, hemoglobin 13.4, normal PTH, normal TSH, negative ANA normal ACE inhibitor, negative rheumatoid arthritis latex test.  He returned to nephrology on 01/16/2022 indicating that his gross hematuria may be related to sexual activity.  He had 2 episodes the previous week.  He reports today that the first episode happened back in 2018.  It was spontaneous.  On subsequent occasions, and seem to be possibly an around the time of sexual activity but necessarily related to ejaculation.  He does not think that he has hematospermia although is not 100% certain about this.  He reports on the last occasion, he had some difficulty urinating, possible clot, the subsequent urination was clear and then the one following was grossly bloody throughout the entirety of the urination.  He provides pictures with him today.  He denies any penile discharge, dysuria, gross hematuria.  He is only discomfort is when he passes a small clot.  No family history of kidney disease or renal bladder abnormalities.  He has no personal history of this.   PMH: No past medical history on file.  Surgical History: No past surgical history on file.  Home Medications:  Allergies as of  02/15/2022   No Known Allergies      Medication List        Accurate as of February 15, 2022 11:55 AM. If you have any questions, ask your nurse or doctor.          STOP taking these medications    diclofenac Sodium 2 % Soln Commonly known as: Pennsaid Stopped by: Vanna Scotland, MD   imiquimod 5 % cream Commonly known as: ALDARA Stopped by: Vanna Scotland, MD   meloxicam 7.5 MG tablet Commonly known as: MOBIC Stopped by: Vanna Scotland, MD   predniSONE 20 MG tablet Commonly known as: DELTASONE Stopped by: Vanna Scotland, MD   tiZANidine 4 MG tablet Commonly known as: ZANAFLEX Stopped by: Vanna Scotland, MD   Vitamin D (Ergocalciferol) 1.25 MG (50000 UNIT) Caps capsule Commonly known as: DRISDOL Stopped by: Vanna Scotland, MD        Allergies: No Known Allergies  Family History: No family history on file.  Social History:  reports that he quit smoking about 11 years ago. His smoking use included cigarettes. He has never used smokeless tobacco. He reports current alcohol use. He reports current drug use. Drug: Marijuana.   Physical Exam: BP 113/77   Pulse (!) 54   Ht 5\' 8"  (1.727 m)   Wt 149 lb (67.6 kg)   BMI 22.66 kg/m   Constitutional:  Alert and oriented, No acute distress. HEENT: [place] AT, moist mucus membranes.  Trachea midline, no masses. Cardiovascular: No clubbing, cyanosis, or edema. Respiratory: Normal respiratory effort, no increased work of breathing. GI: Abdomen is  soft, nontender, nondistended, no abdominal masses GU: Bilateral descended testicles, no masses.  Normal circumcised phallus with orthotopic meatus.   Neurologic: Grossly intact, no focal deficits, moving all 4 extremities. Psychiatric: Normal mood and affect.  Laboratory Data: Lab Results  Component Value Date   WBC 6.0 05/22/2019   HGB 13.4 05/22/2019   HCT 39.6 05/22/2019   MCV 90.0 05/22/2019   PLT 243.0 05/22/2019    Lab Results  Component Value Date    CREATININE 0.89 05/22/2019     Urinalysis    Component Value Date/Time   APPEARANCEUR Clear 02/15/2022 0845   GLUCOSEU Negative 02/15/2022 0845   BILIRUBINUR Negative 02/15/2022 0845   PROTEINUR Negative 02/15/2022 0845   NITRITE Negative 02/15/2022 0845   LEUKOCYTESUR Negative 02/15/2022 0845    Lab Results  Component Value Date   LABMICR See below: 02/15/2022   WBCUA 0-5 02/15/2022   LABEPIT 0-10 02/15/2022   MUCUS Present (A) 02/15/2022   BACTERIA Few 02/15/2022    Pertinent Imaging: Personally reviewed CT scan from 4/22; agree with no GU pathology including on delay.  Small prostatic calcification.    Assessment & Plan:    1. Gross hematuria S/p work up including cysto/CT with and without contrast including delays are also all reassuring without any significant underlying pathology  Records have been requested from Dr. Samuel Germany office today, patient signed release we will review these.  It is unlikely that repeat cystoscopy will be high yield in this fairly young healthy patient.  Based on the nature of his bleeding, it appears that this is probably lower urinary tract in nature.  Urged him to be some correlation with sexual stimulation and/or activity.  Query whether this is perhaps AV malformation or friable vessels within the prostate, seminal vesicles, ejaculatory ducts, etc.  I recommend checking PSA although suspect this will be normal.  Additionally, recommend pelvic MRI to rule out underlying congenital abnormalities within the pelvis.  If this is negative, would recommend intermittent surveillance if he continues to have gross hematuria.  Urinalysis completely negative today, his renal function is normal, unlikely to represent glomerular bleeding.  Defer whether or not to pursue a renal biopsy to a nephrologist but agree that this is likely low yield. - Urinalysis, Complete - Bladder Scan (Post Void Residual) in office - MR PROSTATE W WO CONTRAST; Future -  PSA; Future - PSA  2. Hematospermia Possible, see above   Return for Records release from Dr. Yves Dill.  Hollice Espy, MD  [facility] [address], [place],  [address] [phone]

## 2022-02-16 LAB — PSA: Prostate Specific Ag, Serum: 0.3 ng/mL (ref 0.0–4.0)

## 2022-04-06 ENCOUNTER — Ambulatory Visit: Admission: RE | Admit: 2022-04-06 | Payer: BC Managed Care – PPO | Source: Ambulatory Visit

## 2022-04-06 ENCOUNTER — Ambulatory Visit: Payer: BC Managed Care – PPO | Admitting: Family Medicine

## 2022-04-11 ENCOUNTER — Encounter: Payer: Self-pay | Admitting: Family Medicine

## 2022-04-11 ENCOUNTER — Ambulatory Visit: Payer: Self-pay | Admitting: Family Medicine

## 2022-04-11 DIAGNOSIS — F4389 Other reactions to severe stress: Secondary | ICD-10-CM | POA: Diagnosis not present

## 2022-04-11 DIAGNOSIS — F419 Anxiety disorder, unspecified: Secondary | ICD-10-CM | POA: Diagnosis not present

## 2022-04-11 DIAGNOSIS — A630 Anogenital (venereal) warts: Secondary | ICD-10-CM

## 2022-04-11 NOTE — Progress Notes (Signed)
[facility] Department STI clinic/screening visit  Subjective:  Christopher [name] is a 32 y.o. male being seen today for cryo treatment for genital warts  Patient has the following medical conditions:   Patient Active Problem List   Diagnosis Date Noted   Rupture of UCL of left thumb 12/02/2021   Left wrist pain 06/04/2021   Vitamin D deficiency 01/20/2020   Left shoulder pain 06/24/2019   Polyarthralgia 05/23/2019   Nonallopathic lesion of cervical region 04/22/2019   Nonallopathic lesion of thoracic region 04/22/2019   Nonallopathic lesion of rib cage 04/22/2019   Nonallopathic lesion of lumbosacral region 04/22/2019   Nonallopathic lesion of sacral region 04/22/2019   Atopic dermatitis 07/22/2017     Chief Complaint  Patient presents with   Acute Visit    Here for cryo treatment     HPI  Last HIV test per patient/review of record was  Lab Results  Component Value Date   HMHIVSCREEN Negative - Validated 09/06/2021   No results found for: "HIV"   See flowsheet for further details and programmatic requirements.   Immunization History  Administered Date(s) Administered   HPV 9-valent 08/15/2016, 10/18/2016, 02/21/2017   Hepatitis B, adult February [birth year] 1991, 01/01/1991, 06/04/1991   MMR 07/03/1992, 05/18/1995   Tdap 08/30/2020     The following portions of the patient's history were reviewed and updated as appropriate: allergies, current medications, past medical history, past social history, past surgical history and problem list.  Objective:  There were no vitals filed for this visit.  Physical Exam Genitourinary:    Penis: Circumcised.      Testes: Normal.     Tanner stage (genital): 5.         Assessment and Plan:  Christopher [name] is a 32 y.o. male presenting to the [facility] Department for STI screening  1. Genital warts 3 warts frozen with cryo treatment in 3 freeze thaw cycles- patient tolerated it well.  Recommended  returning for continued or worsening symptoms.   Return if symptoms worsen or fail to improve.  Future Appointments  Date Time Provider [place]  04/14/2022 10:00 AM [facility] [place]  05/09/2022  4:00 PM Lyndal Pulley, DO [facility] None    Sharlet [place], [place]

## 2022-04-14 ENCOUNTER — Ambulatory Visit: Admission: RE | Admit: 2022-04-14 | Payer: BC Managed Care – PPO | Source: Ambulatory Visit

## 2022-05-05 NOTE — Progress Notes (Deleted)
  [facility] [place] Phone: [phone] Subjective:    I'm seeing this patient by the request  of:  Patient, No Pcp Per  CC:   ION:[id number]  Christopher [name] is a 32 y.o. male coming in with complaint of back and neck pain. OMT on 01/13/2022. Also seen for thumb pain. Patient states   Medications patient has been prescribed:   Taking:         Reviewed prior external information including notes and imaging from previsou exam, outside providers and external EMR if available.   As well as notes that were available from care everywhere and other healthcare systems.  Past medical history, social, surgical and family history all reviewed in electronic medical record.  No pertanent information unless stated regarding to the chief complaint.   No past medical history on file.  No Known Allergies   Review of Systems:  No headache, visual changes, nausea, vomiting, diarrhea, constipation, dizziness, abdominal pain, skin rash, fevers, chills, night sweats, weight loss, swollen lymph nodes, body aches, joint swelling, chest pain, shortness of breath, mood changes. POSITIVE muscle aches  Objective  There were no vitals taken for this visit.   General: No apparent distress alert and oriented x3 mood and affect normal, dressed appropriately.  HEENT: Pupils equal, extraocular movements intact  Respiratory: Patient's speak in full sentences and does not appear short of breath  Cardiovascular: No lower extremity edema, non tender, no erythema  Gait MSK:  Back   Osteopathic findings  C2 flexed rotated and side bent right C6 flexed rotated and side bent left T3 extended rotated and side bent right inhaled rib T9 extended rotated and side bent left L2 flexed rotated and side bent right Sacrum right on right       Assessment and Plan:  No problem-specific Assessment & Plan notes found for this encounter.     Nonallopathic problems  Decision today to treat with OMT was based on Physical Exam  After verbal consent patient was treated with HVLA, ME, FPR techniques in cervical, rib, thoracic, lumbar, and sacral  areas  Patient tolerated the procedure well with improvement in symptoms  Patient given exercises, stretches and lifestyle modifications  See medications in patient instructions if given  Patient will follow up in 4-8 weeks             Note: This dictation was prepared with Dragon dictation along with smaller phrase technology. Any transcriptional errors that result from this process are unintentional.

## 2022-05-09 ENCOUNTER — Ambulatory Visit: Payer: BC Managed Care – PPO | Admitting: Family Medicine

## 2022-05-09 DIAGNOSIS — F419 Anxiety disorder, unspecified: Secondary | ICD-10-CM | POA: Diagnosis not present

## 2022-05-09 DIAGNOSIS — F4389 Other reactions to severe stress: Secondary | ICD-10-CM | POA: Diagnosis not present

## 2022-05-18 IMAGING — CT CT ABD-PEL WO/W CM
2 of 8 series · 14 of 46 positions shown, 16 images · IV contrast (omnipaque)
Comparison: None.

CLINICAL DATA: Gross hematuria

EXAM:
CT ABDOMEN AND PELVIS WITHOUT AND WITH CONTRAST
TECHNIQUE: Multidetector CT imaging of the abdomen and pelvis was performed
following the standard protocol before and following the bolus
administration of intravenous contrast.
CONTRAST:  125mL OMNIPAQUE IOHEXOL 300 MG/ML  SOLN

[Series 5: cor without without pre 2.00 cor · coronal · non-contrast · 0.64mm/px · 3 of 113 slices shown]
[im 29/113  soft-tissue]
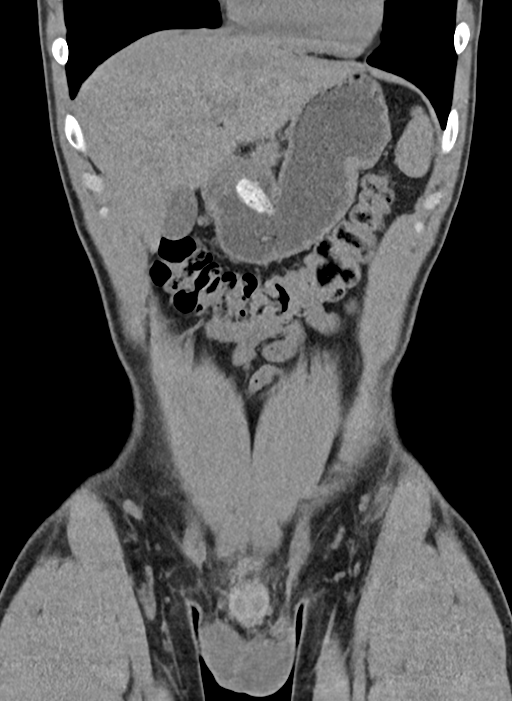
[im 57/113  soft-tissue]
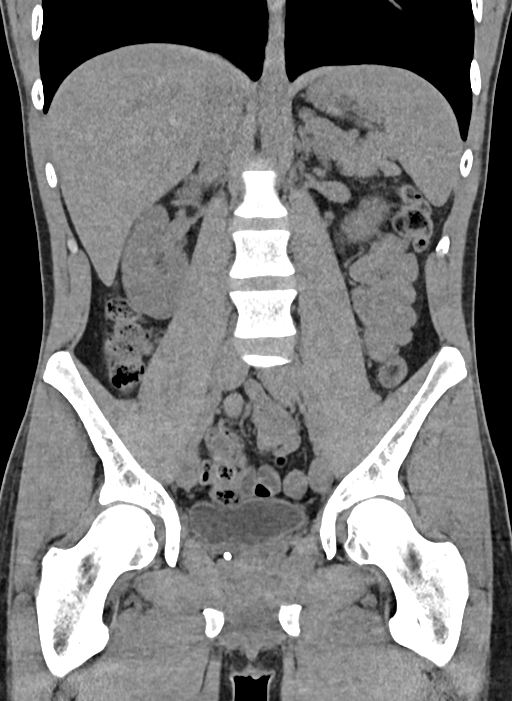
[im 85/113  soft-tissue]
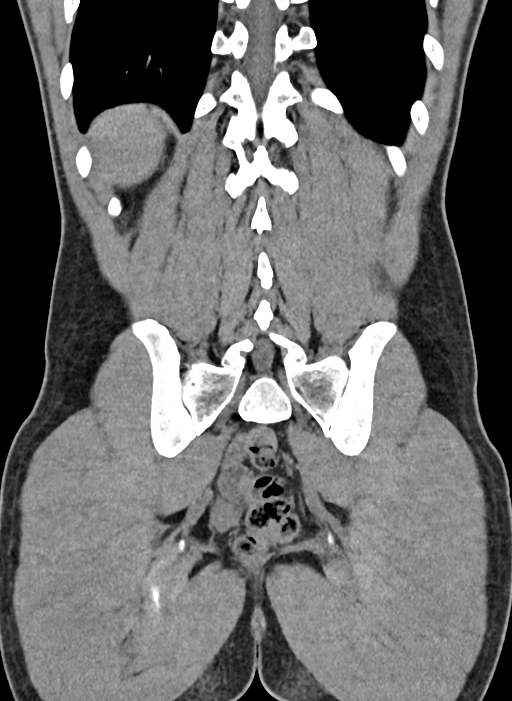

[Series 10: axial delay prone 5.00 ax · axial · delayed · 0.64mm/px · z∈[-1469,-1099]mm · 11 of 90 slices shown, 13 images]
[im 8/90  soft-tissue]
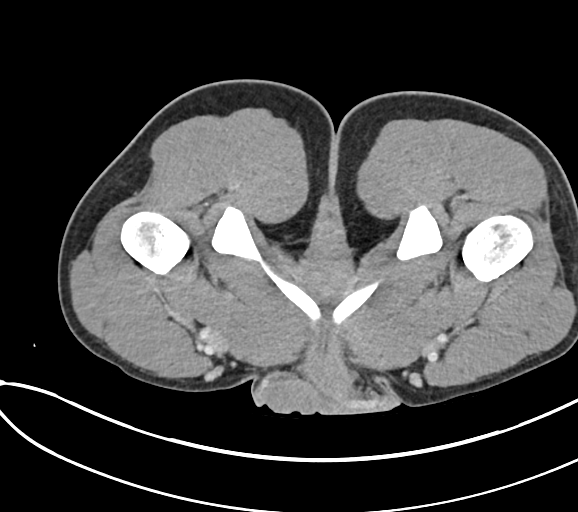
[im 8/90  bone]
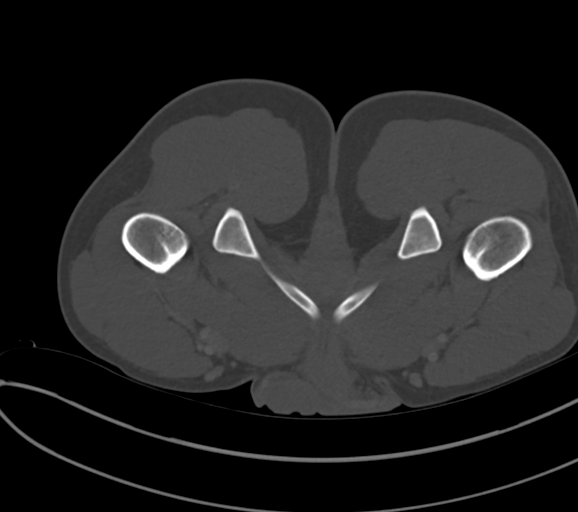
[im 15/90  soft-tissue]
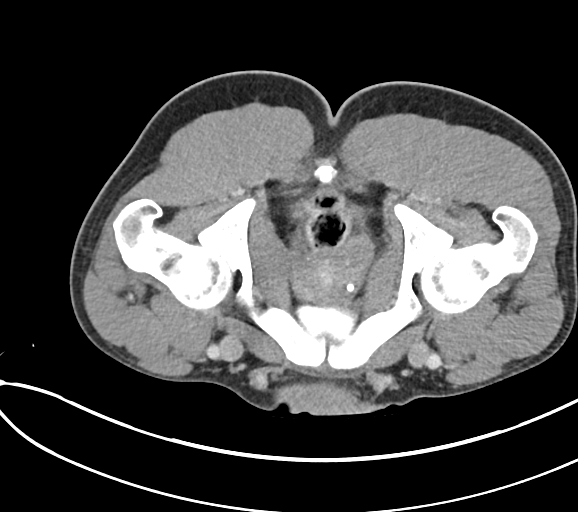
[im 23/90  soft-tissue]
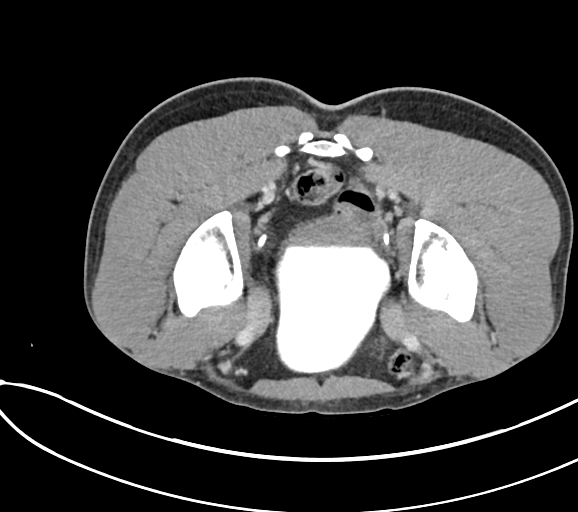
[im 30/90  soft-tissue]
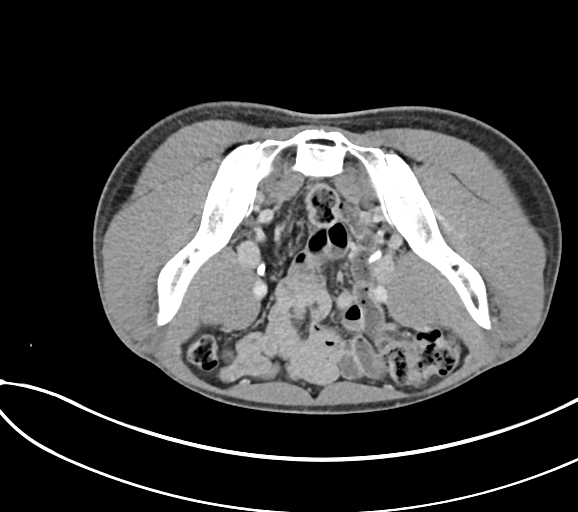
[im 38/90  soft-tissue]
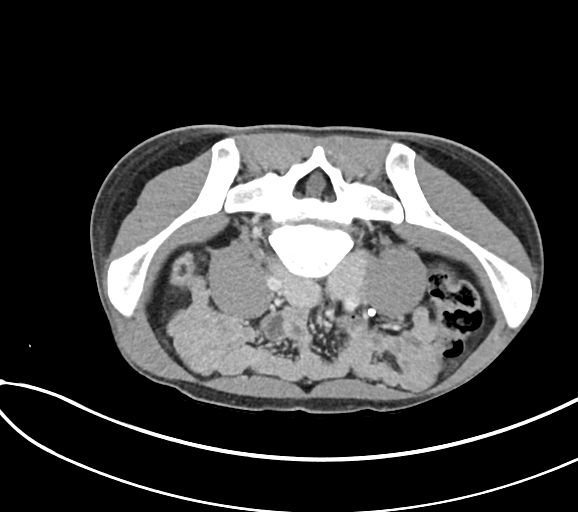
[im 45/90  soft-tissue]
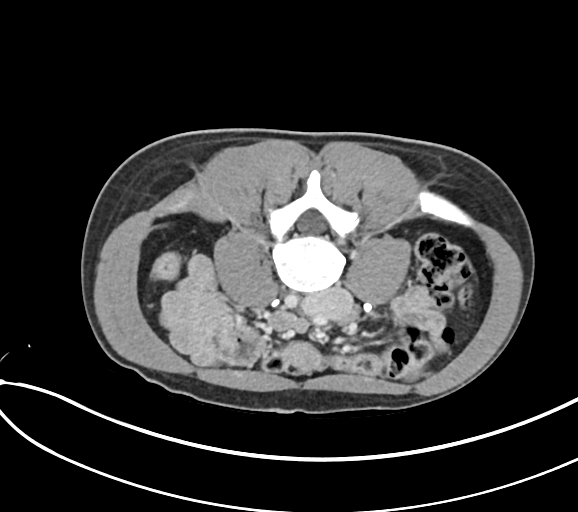
[im 52/90  soft-tissue]
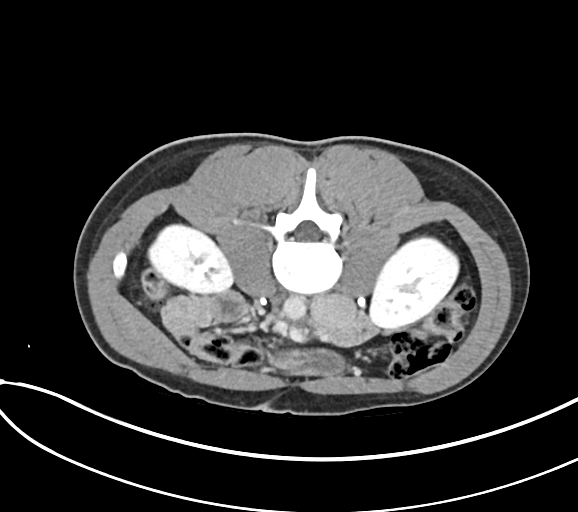
[im 60/90  soft-tissue]
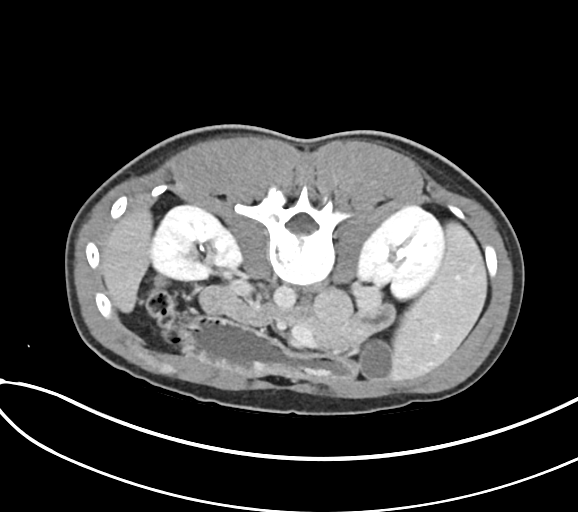
[im 67/90  soft-tissue]
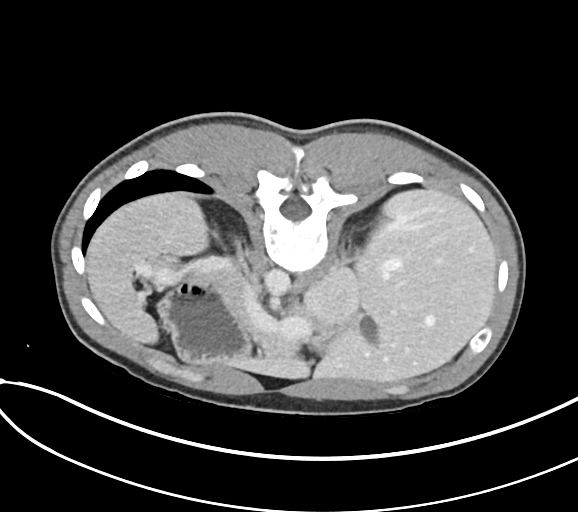
[im 67/90  bone]
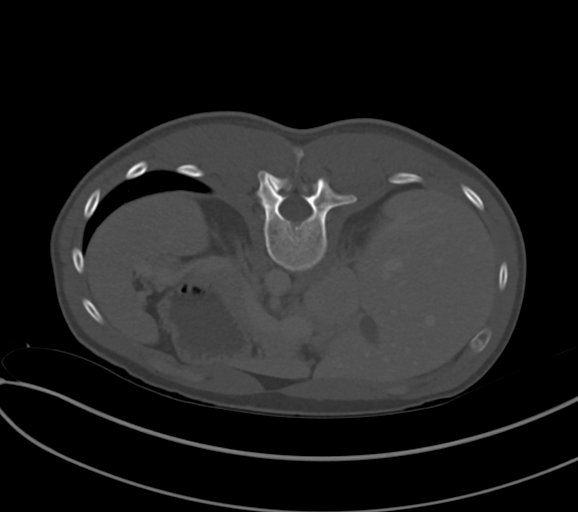
[im 75/90  soft-tissue]
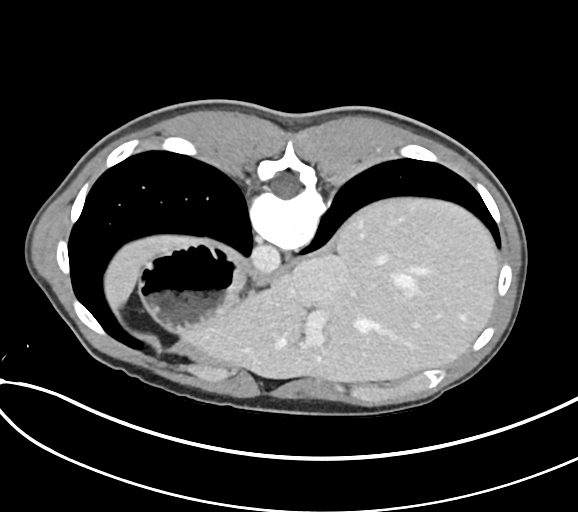
[im 82/90  soft-tissue]
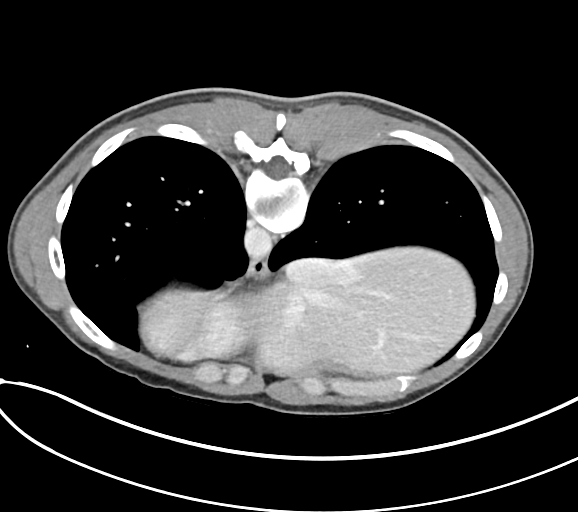

[14 of 46 positions shown; findings below may reference images not displayed]

FINDINGS: Lower chest: No acute abnormality. Normal size heart. No pericardial
effusion.

Hepatobiliary: No suspicious hepatic lesions. Gallbladder is grossly
unremarkable. No biliary ductal dilation.

Pancreas: Unremarkable. No pancreatic ductal dilatation or
surrounding inflammatory changes.

Spleen: Normal in size without focal abnormality.

Adrenals/Urinary Tract: Bilateral adrenal glands are unremarkable.

Symmetric enhancement excretion of contrast in the bilateral
kidneys. No suspicious filling defect visualized within the
opacified portions of the collecting system and ureters on delayed
imaging.

No renal, ureteral or bladder calculus. No solid enhancing renal
mass.

Urinary bladder is grossly unremarkable for degree of distension.

Stomach/Bowel: Stomach is within normal limits. Appendix appears
normal. No evidence of bowel wall thickening, distention, or
inflammatory changes.

Vascular/Lymphatic: No significant vascular findings are present. No
enlarged abdominal or pelvic lymph nodes.

Reproductive: Prostate is unremarkable.

Other: No abdominopelvic ascites.

Musculoskeletal: No acute or significant osseous findings.
IMPRESSION: No hydronephrosis. No renal, ureteral or bladder calculus. No solid
enhancing renal mass.

## 2022-05-19 ENCOUNTER — Encounter: Payer: Self-pay | Admitting: Family Medicine

## 2022-05-19 ENCOUNTER — Ambulatory Visit: Payer: Self-pay | Admitting: Family Medicine

## 2022-05-19 DIAGNOSIS — Z113 Encounter for screening for infections with a predominantly sexual mode of transmission: Secondary | ICD-10-CM

## 2022-05-19 LAB — HM HIV SCREENING LAB: HM HIV Screening: NEGATIVE

## 2022-05-19 NOTE — Progress Notes (Signed)
[facility] Department STI clinic/screening visit  Subjective:  Christopher [name] is a 32 y.o. male being seen today for an STI screening visit. The patient reports they do not have symptoms.    Patient has the following medical conditions:   Patient Active Problem List   Diagnosis Date Noted   Rupture of UCL of left thumb 12/02/2021   Left wrist pain 06/04/2021   Vitamin D deficiency 01/20/2020   Left shoulder pain 06/24/2019   Polyarthralgia 05/23/2019   Nonallopathic lesion of cervical region 04/22/2019   Nonallopathic lesion of thoracic region 04/22/2019   Nonallopathic lesion of rib cage 04/22/2019   Nonallopathic lesion of lumbosacral region 04/22/2019   Nonallopathic lesion of sacral region 04/22/2019   Atopic dermatitis 07/22/2017     Chief Complaint  Patient presents with   SEXUALLY TRANSMITTED DISEASE    HPI  Patient reports to clinic for STI testing  Last HIV test per patient/review of record was  Lab Results  Component Value Date   HMHIVSCREEN Negative - Validated 09/06/2021   No results found for: "HIV"  Does the patient or their partner desires a pregnancy in the next year? No  Screening for MPX risk: Does the patient have an unexplained rash? No Is the patient MSM? No Does the patient endorse multiple sex partners or anonymous sex partners? No Did the patient have close or sexual contact with a person diagnosed with MPX? No Has the patient traveled outside the [place] where MPX is endemic? No Is there a high clinical suspicion for MPX-- evidenced by one of the following No  -Unlikely to be chickenpox  -Lymphadenopathy  -Rash that present in same phase of evolution on any given body part   See flowsheet for further details and programmatic requirements.   Immunization History  Administered Date(s) Administered   HPV 9-valent 08/15/2016, 10/18/2016, 02/21/2017   Hepatitis B, ADULT Aug 20, 1990, 01/01/1991, 06/04/1991   MMR 07/03/1992,  05/18/1995   Tdap 08/30/2020     The following portions of the patient's history were reviewed and updated as appropriate: allergies, current medications, past medical history, past social history, past surgical history and problem list.  Objective:  There were no vitals filed for this visit.  Physical Exam Vitals and nursing note reviewed.  Constitutional:      Appearance: Normal appearance.  HENT:     Head: Normocephalic and atraumatic.     Mouth/Throat:     Mouth: Mucous membranes are moist.     Pharynx: No oropharyngeal exudate or posterior oropharyngeal erythema.  Eyes:     General:        Right eye: No discharge.        Left eye: No discharge.     Conjunctiva/sclera:     Right eye: Right conjunctiva is not injected. No exudate.    Left eye: Left conjunctiva is not injected. No exudate. Pulmonary:     Effort: Pulmonary effort is normal.  Abdominal:     General: Abdomen is flat.     Palpations: Abdomen is soft. There is no hepatomegaly or mass.     Tenderness: There is no abdominal tenderness. There is no rebound.  Genitourinary:    Comments: Declined genital exam- asymptomatic Lymphadenopathy:     Cervical: No cervical adenopathy.     Upper Body:     Right upper body: No supraclavicular or axillary adenopathy.     Left upper body: No supraclavicular or axillary adenopathy.  Skin:    General: Skin is warm  and dry.  Neurological:     Mental Status: He is alert and oriented to person, place, and time.       Assessment and Plan:  [name] is a 32 y.o. male presenting to the [facility] Department for STI screening  1. Screening for venereal disease  - Chlamydia/GC NAA, Confirmation - HIV [place] [facility] - Syphilis Serology, [place] [facility]   Patient does not have STI symptoms Patient accepted all screenings including  urine GC/Chlamydia, and blood work for HIV/Syphilis. Patient meets criteria for HepB screening? No. Ordered? not  applicable Patient meets criteria for HepC screening? No. Ordered? not applicable Recommended condom use with all sex Discussed importance of condom use for STI prevent  Treat positive test results per standing order. Discussed time line for State Lab results and that patient will be called with positive results and encouraged patient to call if he had not heard in 2 weeks Recommended repeat testing in 3 months with positive results. Recommended returning for continued or worsening symptoms.   Return if symptoms worsen or fail to improve.  Total time spent 20 minutes  Christopher [name], [place]

## 2022-05-23 LAB — CHLAMYDIA/GC NAA, CONFIRMATION
Chlamydia trachomatis, NAA: NEGATIVE
Neisseria gonorrhoeae, NAA: NEGATIVE

## 2022-05-24 ENCOUNTER — Ambulatory Visit: Payer: BC Managed Care – PPO

## 2022-06-06 DIAGNOSIS — F4389 Other reactions to severe stress: Secondary | ICD-10-CM | POA: Diagnosis not present

## 2022-06-06 DIAGNOSIS — F419 Anxiety disorder, unspecified: Secondary | ICD-10-CM | POA: Diagnosis not present

## 2022-06-19 LAB — HM HIV SCREENING LAB

## 2022-06-20 ENCOUNTER — Ambulatory Visit: Payer: BC Managed Care – PPO | Admitting: Family Medicine

## 2022-06-20 ENCOUNTER — Encounter: Payer: Self-pay | Admitting: Family Medicine

## 2022-06-20 DIAGNOSIS — Z113 Encounter for screening for infections with a predominantly sexual mode of transmission: Secondary | ICD-10-CM | POA: Diagnosis not present

## 2022-06-20 LAB — HM HIV SCREENING LAB
HM HIV Screening: NEGATIVE
HM HIV Screening: NEGATIVE

## 2022-06-20 NOTE — Progress Notes (Signed)
[facility] Department STI clinic/screening visit  Subjective:  Christopher [name] is a 32 y.o. male being seen today for an STI screening visit. The patient reports they do not have symptoms.    Patient has the following medical conditions:   Patient Active Problem List   Diagnosis Date Noted   Rupture of UCL of left thumb 12/02/2021   Left wrist pain 06/04/2021   Vitamin D deficiency 01/20/2020   Left shoulder pain 06/24/2019   Polyarthralgia 05/23/2019   Nonallopathic lesion of cervical region 04/22/2019   Nonallopathic lesion of thoracic region 04/22/2019   Nonallopathic lesion of rib cage 04/22/2019   Nonallopathic lesion of lumbosacral region 04/22/2019   Nonallopathic lesion of sacral region 04/22/2019   Atopic dermatitis 07/22/2017     Chief Complaint  Patient presents with   SEXUALLY TRANSMITTED DISEASE    STI screening-no symptoms    HPI  Patient reports to clinic for STI testing. Asymptomatic.   Last HIV test per patient/review of record was  Lab Results  Component Value Date   HMHIVSCREEN Negative - Validated 05/19/2022   No results found for: "HIV"  Does the patient or their partner desires a pregnancy in the next year? No  Screening for MPX risk: Does the patient have an unexplained rash? No Is the patient MSM? No Does the patient endorse multiple sex partners or anonymous sex partners? No Did the patient have close or sexual contact with a person diagnosed with MPX? No Has the patient traveled outside the [place] where MPX is endemic? No Is there a high clinical suspicion for MPX-- evidenced by one of the following No  -Unlikely to be chickenpox  -Lymphadenopathy  -Rash that present in same phase of evolution on any given body part   See flowsheet for further details and programmatic requirements.   Immunization History  Administered Date(s) Administered   HPV 9-valent 08/15/2016, 10/18/2016, 02/21/2017   Hepatitis B, ADULT 1990-09-10,  01/01/1991, 06/04/1991   MMR 07/03/1992, 05/18/1995   Tdap 08/30/2020     The following portions of the patient's history were reviewed and updated as appropriate: allergies, current medications, past medical history, past social history, past surgical history and problem list.  Objective:  There were no vitals filed for this visit.  Physical Exam Vitals and nursing note reviewed.  Constitutional:      Appearance: Normal appearance.  HENT:     Head: Normocephalic and atraumatic.     Mouth/Throat:     Mouth: Mucous membranes are moist.     Pharynx: No oropharyngeal exudate or posterior oropharyngeal erythema.  Eyes:     General:        Right eye: No discharge.        Left eye: No discharge.     Conjunctiva/sclera:     Right eye: Right conjunctiva is not injected. No exudate.    Left eye: Left conjunctiva is not injected. No exudate. Pulmonary:     Effort: Pulmonary effort is normal.  Abdominal:     General: Abdomen is flat.     Palpations: Abdomen is soft. There is no hepatomegaly or mass.     Tenderness: There is no abdominal tenderness. There is no rebound.  Genitourinary:    Comments: Declined genital exam- asymptomatic Lymphadenopathy:     Cervical: No cervical adenopathy.     Upper Body:     Right upper body: No supraclavicular or axillary adenopathy.     Left upper body: No supraclavicular or axillary adenopathy.  Skin:    General: Skin is warm and dry.  Neurological:     Mental Status: He is alert and oriented to person, place, and time.       Assessment and Plan:  Christopher [name] is a 32 y.o. male presenting to the [facility] Department for STI screening  1. Screening for venereal disease  - HIV [place] [facility] - Syphilis Serology, [place] [facility] - Chlamydia/GC NAA, Confirmation - Gonococcus culture   Patient does not have STI symptoms Patient accepted all screenings including  urine GC/Chlamydia, and blood work for HIV/Syphilis. Patient  meets criteria for HepB screening? No. Ordered? not applicable Patient meets criteria for HepC screening? No. Ordered? not applicable Recommended condom use with all sex Discussed importance of condom use for STI prevent  Treat positive test results per standing order. Discussed time line for State Lab results and that patient will be called with positive results and encouraged patient to call if he had not heard in 2 weeks Recommended repeat testing in 3 months with positive results. Recommended returning for continued or worsening symptoms.   Return if symptoms worsen or fail to improve.  Total time spent 15 minutes.  Sharlet Salina, [place]

## 2022-06-20 NOTE — Progress Notes (Signed)
Pt here for STI screening.  Pt is asymptomatic.  No in house labs performed.  Pt declines Katherine Roan, RN

## 2022-06-23 LAB — CHLAMYDIA/GC NAA, CONFIRMATION
Chlamydia trachomatis, NAA: NEGATIVE
Neisseria gonorrhoeae, NAA: NEGATIVE

## 2022-06-24 LAB — GONOCOCCUS CULTURE

## 2022-07-03 ENCOUNTER — Telehealth: Payer: Self-pay

## 2022-07-03 ENCOUNTER — Telehealth: Payer: Self-pay | Admitting: Family Medicine

## 2022-07-03 NOTE — Telephone Encounter (Signed)
Pt called and was anxious because his HIV in My Chart said positive.  Explained to the patient that this must have been entered in by mistake because his HIV test had not been ordered correctly on the State form.  Corrected form faxed to [facility] today.  Pt notified that it can take up to a week for those results.

## 2022-07-03 NOTE — Telephone Encounter (Signed)
Patient call concerned about a STI result.

## 2022-07-03 NOTE — Telephone Encounter (Signed)
Please call me back I missed your call

## 2022-07-03 NOTE — Telephone Encounter (Signed)
Questions answered regarding HIV and Syphilis

## 2022-07-03 NOTE — Telephone Encounter (Signed)
LM that HIV labs have been updated in My Chart

## 2022-07-11 DIAGNOSIS — F419 Anxiety disorder, unspecified: Secondary | ICD-10-CM | POA: Diagnosis not present

## 2022-07-11 DIAGNOSIS — F4389 Other reactions to severe stress: Secondary | ICD-10-CM | POA: Diagnosis not present

## 2022-08-01 ENCOUNTER — Telehealth: Payer: Self-pay | Admitting: Nurse Practitioner

## 2022-08-01 NOTE — Telephone Encounter (Signed)
S/w pt today regarding scheduling appt, pt has been seen @ office since 2019 pt is aware if it gets worse to go to Urgent care but pt is ok with scheduled appt for 08/04/22 per pt-nm

## 2022-08-04 ENCOUNTER — Ambulatory Visit (INDEPENDENT_AMBULATORY_CARE_PROVIDER_SITE_OTHER): Payer: BC Managed Care – PPO | Admitting: Nurse Practitioner

## 2022-08-04 ENCOUNTER — Encounter: Payer: Self-pay | Admitting: Nurse Practitioner

## 2022-08-04 VITALS — BP 130/88 | HR 60 | Temp 97.6°F | Resp 16 | Ht 67.0 in | Wt 151.8 lb

## 2022-08-04 DIAGNOSIS — B081 Molluscum contagiosum: Secondary | ICD-10-CM | POA: Diagnosis not present

## 2022-08-04 DIAGNOSIS — R21 Rash and other nonspecific skin eruption: Secondary | ICD-10-CM | POA: Diagnosis not present

## 2022-08-04 DIAGNOSIS — E782 Mixed hyperlipidemia: Secondary | ICD-10-CM

## 2022-08-04 DIAGNOSIS — R238 Other skin changes: Secondary | ICD-10-CM | POA: Diagnosis not present

## 2022-08-04 DIAGNOSIS — Z113 Encounter for screening for infections with a predominantly sexual mode of transmission: Secondary | ICD-10-CM

## 2022-08-04 DIAGNOSIS — Z0000 Encounter for general adult medical examination without abnormal findings: Secondary | ICD-10-CM

## 2022-08-04 MED ORDER — TRIAMCINOLONE ACETONIDE 0.1 % EX CREA
1.0000 | TOPICAL_CREAM | Freq: Two times a day (BID) | CUTANEOUS | 0 refills | Status: AC
Start: 2022-08-04 — End: ?

## 2022-08-04 NOTE — Progress Notes (Unsigned)
[facility] [address] [place], [place] [address]  Internal MEDICINE  Office Visit Note  Patient Name: Christopher [name]  [id number]  [id number]  Date of Service: 08/04/2022   Complaints/HPI Pt is here for establishment of PCP. Chief Complaint  Patient presents with   New Patient (Initial Visit)    Rash all over body.     HPI Christopher [name] presents for a new patient visit to establish care.  Well-appearing 32 y.o. male with no major medical conditions. Work: Programmer, multimedia  Home: live with family but moving to [employer].  Diet: fairly healthy  Exercise: not right now but plans to get back into, walks about 5-10 miles with work.  Tobacco use: none  Alcohol use: occasionally  Illicit drug use: -- marijuana occasionally.  Labs: due for routine labs  New or worsening pain: none Scattered itch vesicular Rash -- arms, inner thighs, hands, behind right ear and back of neck. Rash is itchy Sleeps naked not sure if he spread it in his sleep. Wants to be checked for STDs   Current Medication: Outpatient Encounter Medications as of 08/04/2022  Medication Sig   triamcinolone cream (KENALOG) 0.1 % Apply 1 Application topically 2 (two) times daily.   No facility-administered encounter medications on file as of 08/04/2022.    Surgical History: History reviewed. No pertinent surgical history.  Medical History: History reviewed. No pertinent past medical history.  Family History: History reviewed. No pertinent family history.  Social History   Socioeconomic History   Marital status: Single    Spouse name: Not on file   Number of children: Not on file   Years of education: Not on file   Highest education level: Not on file  Occupational History   Not on file  Tobacco Use   Smoking status: Former    Types: Cigarettes    Quit date: 11/26/2010    Years since quitting: 11.6   Smokeless tobacco: Never   Tobacco comments:    Only smoked when at a party, no  cigarette use since 2012.  Vaping Use   Vaping Use: Never used  Substance and Sexual Activity   Alcohol use: Yes    Comment: occ   Drug use: Yes    Types: Marijuana    Comment: last used 05/2021   Sexual activity: Yes    Partners: Female    Birth control/protection: None, Condom  Other Topics Concern   Not on file  Social History Narrative   Not on file   Social Determinants of Health   Financial Resource Strain: Not on file  Food Insecurity: Not on file  Transportation Needs: Not on file  Physical Activity: Not on file  Stress: Not on file  Social Connections: Not on file  Intimate Partner Violence: Not At Risk (09/06/2021)   Humiliation, Afraid, Rape, and Kick questionnaire    Fear of Current or Ex-Partner: No    Emotionally Abused: No    Physically Abused: No    Sexually Abused: No     Review of Systems  Constitutional:  Negative for chills, fatigue and unexpected weight change.  HENT:  Negative for congestion, postnasal drip, rhinorrhea, sneezing and sore throat.   Respiratory: Negative.  Negative for cough, chest tightness, shortness of breath and wheezing.   Cardiovascular: Negative.  Negative for chest pain and palpitations.  Gastrointestinal:  Negative for abdominal pain, constipation, diarrhea, nausea and vomiting.  Musculoskeletal:  Negative for arthralgias, back pain, joint swelling and neck pain.  Skin:  Positive for rash (on arms, neck, behind right ear, hands, inner thighs, legs and on penis.).  Neurological: Negative.  Negative for tremors and numbness.  Hematological:  Negative for adenopathy. Does not bruise/bleed easily.  Psychiatric/Behavioral:  Negative for behavioral problems (Depression), sleep disturbance and suicidal ideas. The patient is not nervous/anxious.     Vital Signs: BP 130/88   Pulse 60   Temp 97.6 F (36.4 C)   Resp 16   Ht 5\' 7"  (1.702 m)   Wt 151 lb 12.8 oz (68.9 kg)   SpO2 96%   BMI 23.78 kg/m    Physical Exam Vitals  reviewed.  Constitutional:      General: He is not in acute distress.    Appearance: Normal appearance. He is normal weight. He is not ill-appearing.  HENT:     Head: Normocephalic and atraumatic.  Eyes:     Pupils: Pupils are equal, round, and reactive to light.  Cardiovascular:     Rate and Rhythm: Normal rate and regular rhythm.  Pulmonary:     Effort: Pulmonary effort is normal. No respiratory distress.  Skin:    Findings: Rash present. Rash is vesicular.     Comments: Itchy vesicular rash scattered in different places seems to be spread by physical contact.   Neurological:     Mental Status: He is alert and oriented to person, place, and time.  Psychiatric:        Mood and Affect: Mood normal.        Behavior: Behavior normal.       Assessment/Plan: 1. Molluscum contagiosum Topical steroid provided to alleviate itching Start cimetidine 200 mg twice daily x 2 months.  Consider dermatology referral if no improvement.  - triamcinolone cream (KENALOG) 0.1 %; Apply 1 Application topically 2 (two) times daily.  Dispense: 30 g; Refill: 0  2. Vesicular rash Rash is not likely caused by STD but will rule out anyway. - Chlamydia/Gonococcus/Trichomonas, NAA - HSV 1 and 2 Ab, IgG  3. Mixed hyperlipidemia Routine labs ordered  - CBC with Differential/Platelet - CMP14+EGFR - Lipid Profile  4. Screening for STDs (sexually transmitted diseases) Urine sent for testing and will test for HSV.  - Chlamydia/Gonococcus/Trichomonas, NAA - HSV 1 and 2 Ab, IgG  5. Routine health maintenance Routine labs ordered - CBC with Differential/Platelet - CMP14+EGFR - Lipid Profile    General Counseling: Christopher [name] verbalizes understanding of the findings of todays visit and agrees with plan of treatment. I have discussed any further diagnostic evaluation that may be needed or ordered today. We also reviewed his medications today. he has been encouraged to call the office with any questions or  concerns that should arise related to todays visit.    Orders Placed This Encounter  Procedures   Chlamydia/Gonococcus/Trichomonas, NAA   CBC with Differential/Platelet   CMP14+EGFR   Lipid Profile   HSV 1 and 2 Ab, IgG    Meds ordered this encounter  Medications   triamcinolone cream (KENALOG) 0.1 %    Sig: Apply 1 Application topically 2 (two) times daily.    Dispense:  30 g    Refill:  0    Return for CPE, Jaritza Duignan PCP at earliest opening available, please have labs done before visit.  Time spent:30 Minutes Time spent with patient included reviewing progress notes, labs, imaging studies, and discussing plan for follow up.   [place] Controlled Substance Database was reviewed by me for overdose risk score (ORS)   This patient was seen by  Sallyanne Kuster, FNP-C in collaboration with Dr. Beverely Risen as a part of collaborative care agreement.   Avenell Sellers R. Tedd Sias, MSN, FNP-C Internal Medicine

## 2022-08-05 ENCOUNTER — Encounter: Payer: Self-pay | Admitting: Nurse Practitioner

## 2022-08-05 DIAGNOSIS — R21 Rash and other nonspecific skin eruption: Secondary | ICD-10-CM | POA: Insufficient documentation

## 2022-08-05 LAB — HSV 1 AND 2 AB, IGG
HSV 1 Glycoprotein G Ab, IgG: 58.1 index — ABNORMAL HIGH (ref 0.00–0.90)
HSV 2 IgG, Type Spec: <0.91 index (ref 0.00–0.90)

## 2022-08-05 MED ORDER — CIMETIDINE 200 MG PO TABS: 200.0000 mg | ORAL_TABLET | Freq: Two times a day (BID) | ORAL | Status: AC

## 2022-08-07 ENCOUNTER — Encounter: Payer: Self-pay | Admitting: Nurse Practitioner

## 2022-08-08 LAB — CHLAMYDIA/GONOCOCCUS/TRICHOMONAS, NAA
Chlamydia by NAA: NEGATIVE
Gonococcus by NAA: NEGATIVE
Trich vag by NAA: NEGATIVE

## 2022-08-22 ENCOUNTER — Telehealth: Payer: Self-pay | Admitting: Nurse Practitioner

## 2022-08-22 NOTE — Telephone Encounter (Signed)
Lvm to move 09/07/22 appointment-Toni

## 2022-09-04 DIAGNOSIS — E782 Mixed hyperlipidemia: Secondary | ICD-10-CM | POA: Diagnosis not present

## 2022-09-04 DIAGNOSIS — Z0000 Encounter for general adult medical examination without abnormal findings: Secondary | ICD-10-CM | POA: Diagnosis not present

## 2022-09-05 DIAGNOSIS — F4389 Other reactions to severe stress: Secondary | ICD-10-CM | POA: Diagnosis not present

## 2022-09-05 DIAGNOSIS — F419 Anxiety disorder, unspecified: Secondary | ICD-10-CM | POA: Diagnosis not present

## 2022-09-05 LAB — CBC WITH DIFFERENTIAL/PLATELET
Basophils Absolute: 0 10*3/uL (ref 0.0–0.2)
Basos: 0 %
EOS (ABSOLUTE): 0.3 10*3/uL (ref 0.0–0.4)
Eos: 5 %
Hematocrit: 43.3 % (ref 37.5–51.0)
Hemoglobin: 14.2 g/dL (ref 13.0–17.7)
Immature Grans (Abs): 0 10*3/uL (ref 0.0–0.1)
Immature Granulocytes: 0 %
Lymphocytes Absolute: 2.5 10*3/uL (ref 0.7–3.1)
Lymphs: 49 %
MCH: 29.2 pg (ref 26.6–33.0)
MCHC: 32.8 g/dL (ref 31.5–35.7)
MCV: 89 fL (ref 79–97)
Monocytes Absolute: 0.5 10*3/uL (ref 0.1–0.9)
Monocytes: 9 %
Neutrophils Absolute: 1.9 10*3/uL (ref 1.4–7.0)
Neutrophils: 37 %
Platelets: 279 10*3/uL (ref 150–450)
RBC: 4.87 x10E6/uL (ref 4.14–5.80)
RDW: 12.1 % (ref 11.6–15.4)
WBC: 5.1 10*3/uL (ref 3.4–10.8)

## 2022-09-05 LAB — CMP14+EGFR
ALT: 15 IU/L (ref 0–44)
AST: 20 IU/L (ref 0–40)
Albumin/Globulin Ratio: 1.9
Albumin: 4.5 g/dL (ref 4.1–5.1)
Alkaline Phosphatase: 44 IU/L (ref 44–121)
BUN/Creatinine Ratio: 22 — ABNORMAL HIGH (ref 9–20)
BUN: 22 mg/dL — ABNORMAL HIGH (ref 6–20)
Bilirubin Total: 0.8 mg/dL (ref 0.0–1.2)
CO2: 24 mmol/L (ref 20–29)
Calcium: 9.3 mg/dL (ref 8.7–10.2)
Chloride: 101 mmol/L (ref 96–106)
Creatinine, Ser: 0.98 mg/dL (ref 0.76–1.27)
Globulin, Total: 2.4 g/dL (ref 1.5–4.5)
Glucose: 93 mg/dL (ref 70–99)
Potassium: 4.3 mmol/L (ref 3.5–5.2)
Sodium: 138 mmol/L (ref 134–144)
Total Protein: 6.9 g/dL (ref 6.0–8.5)
eGFR: 106 mL/min/{1.73_m2} (ref >59)

## 2022-09-05 LAB — LIPID PANEL
Chol/HDL Ratio: 3 ratio (ref 0.0–5.0)
Cholesterol, Total: 251 mg/dL — ABNORMAL HIGH (ref 100–199)
HDL: 85 mg/dL (ref >39)
LDL Chol Calc (NIH): 151 mg/dL — ABNORMAL HIGH (ref 0–99)
Triglycerides: 88 mg/dL (ref 0–149)
VLDL Cholesterol Cal: 15 mg/dL (ref 5–40)

## 2022-09-07 ENCOUNTER — Ambulatory Visit (INDEPENDENT_AMBULATORY_CARE_PROVIDER_SITE_OTHER): Payer: BC Managed Care – PPO | Admitting: Nurse Practitioner

## 2022-09-07 ENCOUNTER — Encounter: Payer: Self-pay | Admitting: Nurse Practitioner

## 2022-09-07 VITALS — BP 110/70 | HR 62 | Temp 98.5°F | Resp 16 | Ht 67.0 in | Wt 152.2 lb

## 2022-09-07 DIAGNOSIS — B081 Molluscum contagiosum: Secondary | ICD-10-CM | POA: Diagnosis not present

## 2022-09-07 DIAGNOSIS — Z0001 Encounter for general adult medical examination with abnormal findings: Secondary | ICD-10-CM | POA: Diagnosis not present

## 2022-09-07 DIAGNOSIS — R300 Dysuria: Secondary | ICD-10-CM

## 2022-09-07 DIAGNOSIS — E782 Mixed hyperlipidemia: Secondary | ICD-10-CM | POA: Diagnosis not present

## 2022-09-07 NOTE — Progress Notes (Signed)
[facility] [address] [place], [place] [address]  Internal MEDICINE  Office Visit Note  Patient Name: Christopher [name]  [id number]  [id number]  Date of Service: 09/07/2022  Chief Complaint  Patient presents with   Annual Exam    HPI Christopher [name] presents for an annual well visit and physical exam.  Well-appearing 32 y.o. male with no significant medical problems. Currently taking cimetidine to treat molluscum contagiosum which he reports is clearing up. He has just a few more days of the medication and then he will be finished with taking it.  Labs: CBC and CMP are normal LDL is elevated at 151 -- patient reports he eats red meat several times a week.  New or worsening pain: none  Other concerns: none    Current Medication: Outpatient Encounter Medications as of 09/07/2022  Medication Sig   cimetidine (TAGAMET) 200 MG tablet Take 1 tablet (200 mg total) by mouth 2 (two) times daily.   triamcinolone cream (KENALOG) 0.1 % Apply 1 Application topically 2 (two) times daily.   No facility-administered encounter medications on file as of 09/07/2022.    Surgical History: History reviewed. No pertinent surgical history.  Medical History: History reviewed. No pertinent past medical history.  Family History: History reviewed. No pertinent family history.  Social History   Socioeconomic History   Marital status: Single    Spouse name: Not on file   Number of children: Not on file   Years of education: Not on file   Highest education level: Not on file  Occupational History   Not on file  Tobacco Use   Smoking status: Former    Types: Cigarettes    Quit date: 11/26/2010    Years since quitting: 11.7   Smokeless tobacco: Never   Tobacco comments:    Only smoked when at a party, no cigarette use since 2012.  Vaping Use   Vaping Use: Never used  Substance and Sexual Activity   Alcohol use: Yes    Comment: occ   Drug use: Yes    Types: Marijuana    Comment:  last used 05/2021   Sexual activity: Yes    Partners: Female    Birth control/protection: None, Condom  Other Topics Concern   Not on file  Social History Narrative   Not on file   Social Determinants of Health   Financial Resource Strain: Not on file  Food Insecurity: Not on file  Transportation Needs: Not on file  Physical Activity: Not on file  Stress: Not on file  Social Connections: Not on file  Intimate Partner Violence: Not At Risk (09/06/2021)   Humiliation, Afraid, Rape, and Kick questionnaire    Fear of Current or Ex-Partner: No    Emotionally Abused: No    Physically Abused: No    Sexually Abused: No      Review of Systems  Constitutional:  Negative for activity change, appetite change, chills, fatigue, fever and unexpected weight change.  HENT: Negative.  Negative for congestion, ear pain, rhinorrhea, sore throat and trouble swallowing.   Eyes: Negative.   Respiratory: Negative.  Negative for cough, chest tightness, shortness of breath and wheezing.   Cardiovascular: Negative.  Negative for chest pain and palpitations.  Gastrointestinal: Negative.  Negative for abdominal pain, blood in stool, constipation, diarrhea, nausea and vomiting.  Endocrine: Negative.   Genitourinary: Negative.  Negative for difficulty urinating, dysuria, frequency, hematuria and urgency.  Musculoskeletal: Negative.  Negative for arthralgias, back pain, joint swelling, myalgias and  neck pain.  Skin:  Positive for rash (resolving). Negative for wound.  Allergic/Immunologic: Negative.  Negative for immunocompromised state.  Neurological: Negative.  Negative for dizziness, seizures, numbness and headaches.  Hematological: Negative.   Psychiatric/Behavioral: Negative.  Negative for behavioral problems, self-injury and suicidal ideas. The patient is not nervous/anxious.     Vital Signs: BP 110/70   Pulse 62   Temp 98.5 F (36.9 C)   Resp 16   Ht 5\' 7"  (1.702 m)   Wt 152 lb 3.2 oz (69 kg)    SpO2 96%   BMI 23.84 kg/m    Physical Exam Vitals reviewed.  Constitutional:      General: He is not in acute distress.    Appearance: Normal appearance. He is well-developed and normal weight. He is not ill-appearing or diaphoretic.  HENT:     Head: Normocephalic and atraumatic.     Right Ear: Tympanic membrane, ear canal and external ear normal.     Left Ear: Tympanic membrane, ear canal and external ear normal.     Nose: Nose normal. No congestion or rhinorrhea.     Mouth/Throat:     Mouth: Mucous membranes are moist.     Pharynx: Oropharynx is clear. No oropharyngeal exudate or posterior oropharyngeal erythema.  Eyes:     General: No scleral icterus.       Right eye: No discharge.        Left eye: No discharge.     Extraocular Movements: Extraocular movements intact.     Conjunctiva/sclera: Conjunctivae normal.     Pupils: Pupils are equal, round, and reactive to light.  Neck:     Thyroid: No thyromegaly.     Vascular: No JVD.     Trachea: No tracheal deviation.  Cardiovascular:     Rate and Rhythm: Normal rate and regular rhythm.     Heart sounds: Normal heart sounds. No murmur heard.    No friction rub. No gallop.  Pulmonary:     Effort: Pulmonary effort is normal. No respiratory distress.     Breath sounds: Normal breath sounds. No stridor. No wheezing or rales.  Chest:     Chest wall: No tenderness.  Abdominal:     General: Bowel sounds are normal. There is no distension.     Palpations: Abdomen is soft. There is no mass.     Tenderness: There is no abdominal tenderness. There is no guarding or rebound.  Musculoskeletal:        General: No tenderness or deformity. Normal range of motion.     Cervical back: Normal range of motion and neck supple.     Right lower leg: No edema.     Left lower leg: No edema.  Lymphadenopathy:     Cervical: No cervical adenopathy.  Skin:    General: Skin is warm and dry.     Capillary Refill: Capillary refill takes less than  2 seconds.     Coloration: Skin is not pale.     Findings: No erythema or rash.  Neurological:     Mental Status: He is alert and oriented to person, place, and time.     Cranial Nerves: No cranial nerve deficit.     Motor: No abnormal muscle tone.     Coordination: Coordination normal.     Gait: Gait normal.     Deep Tendon Reflexes: Reflexes are normal and symmetric.  Psychiatric:        Mood and Affect: Mood normal.  Behavior: Behavior normal.        Thought Content: Thought content normal.        Judgment: Judgment normal.        Assessment/Plan: 1. Encounter for routine adult health examination with abnormal findings Age-appropriate preventive screenings and vaccinations discussed, annual physical exam completed. Routine labs for health maintenance results discussed with patient today. PHM updated.   2. Mixed hyperlipidemia Limit red meat intake, increase lean protein intake, take a fish oil or flaxseed oil supplement.   3. Molluscum contagiosum Rash is resolving   4. Dysuria Routine urinalysis  - UA/M w/rflx Culture, Routine     General Counseling: Yuvin verbalizes understanding of the findings of todays visit and agrees with plan of treatment. I have discussed any further diagnostic evaluation that may be needed or ordered today. We also reviewed his medications today. he has been encouraged to call the office with any questions or concerns that should arise related to todays visit.    Orders Placed This Encounter  Procedures   UA/M w/rflx Culture, Routine    No orders of the defined types were placed in this encounter.   Return in about 1 year (around 09/07/2023) for CPE, Jewelianna Pancoast PCP and otherwise as needed.   Total time spent:30 Minutes Time spent includes review of chart, medications, test results, and follow up plan with the patient.   [place] Controlled Substance Database was reviewed by me.  This patient was seen by Sallyanne Kuster, FNP-C in  collaboration with Dr. Beverely Risen as a part of collaborative care agreement.  Choua Chalker R. Tedd Sias, MSN, FNP-C Internal medicine

## 2022-10-06 ENCOUNTER — Ambulatory Visit: Payer: BC Managed Care – PPO

## 2022-10-06 ENCOUNTER — Telehealth: Payer: Self-pay

## 2022-10-06 NOTE — Telephone Encounter (Signed)
Pt had an appointment scheduled for today, however, our Provider is out.  Rescheduled appointment for Monday 7/15 @ 8:15

## 2022-10-09 ENCOUNTER — Ambulatory Visit: Payer: BC Managed Care – PPO | Admitting: Family Medicine

## 2022-10-09 ENCOUNTER — Encounter: Payer: Self-pay | Admitting: Family Medicine

## 2022-10-09 DIAGNOSIS — A630 Anogenital (venereal) warts: Secondary | ICD-10-CM

## 2022-10-09 NOTE — Progress Notes (Signed)
Pt here for cryotherapy.  Declines other STI screening today.  Condoms declined.  Seen by Aliene Altes, FNP-Adelene Polivka Alton Revere, RN

## 2022-10-09 NOTE — Progress Notes (Signed)
[facility] Department STI clinic/screening visit  Subjective:  Christopher [name] is a 32 y.o. male being seen today for an cryo appointment. The patient reports they do have symptoms.    Patient has the following medical conditions:   Patient Active Problem List   Diagnosis Date Noted   Generalized papular rash 08/05/2022   Rupture of UCL of left thumb 12/02/2021   Left wrist pain 06/04/2021   Vitamin D deficiency 01/20/2020   Left shoulder pain 06/24/2019   Polyarthralgia 05/23/2019   Nonallopathic lesion of cervical region 04/22/2019   Nonallopathic lesion of thoracic region 04/22/2019   Nonallopathic lesion of rib cage 04/22/2019   Nonallopathic lesion of lumbosacral region 04/22/2019   Nonallopathic lesion of sacral region 04/22/2019   Atopic dermatitis 07/22/2017     Chief Complaint  Patient presents with   SEXUALLY TRANSMITTED DISEASE    HPI  Patient reports to clinic for cryo tx of genital warts  Last HIV test per patient/review of record was  Lab Results  Component Value Date   HMHIVSCREEN Negative - Validated 06/20/2022   HMHIVSCREEN Negative - Validated 06/20/2022   No results found for: "HIV"  Does the patient or their partner desires a pregnancy in the next year? No  Screening for MPX risk: Does the patient have an unexplained rash? No Is the patient MSM? No Does the patient endorse multiple sex partners or anonymous sex partners? No Did the patient have close or sexual contact with a person diagnosed with MPX? No Has the patient traveled outside the [place] where MPX is endemic? No Is there a high clinical suspicion for MPX-- evidenced by one of the following No  -Unlikely to be chickenpox  -Lymphadenopathy  -Rash that present in same phase of evolution on any given body part   See flowsheet for further details and programmatic requirements.   Immunization History  Administered Date(s) Administered   HPV 9-valent 08/15/2016, 10/18/2016,  02/21/2017   Hepatitis B, ADULT 08[birth year]09/90, 01/01/1991, 06/04/1991   MMR 07/03/1992, 05/18/1995   Tdap 08/30/2020     The following portions of the patient's history were reviewed and updated as appropriate: allergies, current medications, past medical history, past social history, past surgical history and problem list.  Objective:  There were no vitals filed for this visit.  Physical Exam Constitutional:      Appearance: Normal appearance.  HENT:     Head:     Comments: No nits or hair loss    Mouth/Throat:     Mouth: No oral lesions.  Eyes:     Conjunctiva/sclera:     Right eye: Right conjunctiva is not injected. No exudate.    Left eye: Left conjunctiva is not injected. No exudate. Abdominal:     Palpations: There is no hepatomegaly.     Hernia: There is no hernia in the left inguinal area or right inguinal area.  Genitourinary:    Pubic Area: No rash or pubic lice (no nits).      Penis: Lesions present. No tenderness, discharge or swelling.      Testes: Normal.     Epididymis:     Right: Normal. No mass or tenderness.     Left: Normal. No mass or tenderness.     Rectum: Normal. No tenderness (no lesions or discharge).       Comments: 1 genital wart as indicated by red X 1 possible wart located on the dorsal side of the shaft of penis Lymphadenopathy:  Head:     Right side of head: No preauricular or posterior auricular adenopathy.     Left side of head: No preauricular or posterior auricular adenopathy.     Upper Body:     Right upper body: No supraclavicular, axillary or epitrochlear adenopathy.     Left upper body: No supraclavicular, axillary or epitrochlear adenopathy.     Lower Body: No right inguinal adenopathy. No left inguinal adenopathy.  Skin:    General: Skin is warm and dry.     Findings: No lesion or rash.  Neurological:     Mental Status: He is alert and oriented to person, place, and time.       Assessment and Plan:  Christopher [name] is  a 32 y.o. male presenting to the [facility] Department for STI screening  1. Genital warts 2 warts frozen with cryo treatment in 3 freeze thaw cycles- patient tolerated it well.  -Counseled to RTC in 10-14 days for another tx with continued lesions   Patient does have STI symptoms Patient accepted all screenings including  urine GC/Chlamydia, and blood work for HIV/Syphilis. Patient meets criteria for HepB screening? No. Ordered? not applicable Patient meets criteria for HepC screening? No. Ordered? not applicable Recommended condom use with all sex Discussed importance of condom use for STI prevent  Treat positive test results per standing order. Discussed time line for State Lab results and that patient will be called with positive results and encouraged patient to call if he had not heard in 2 weeks Recommended repeat testing in 3 months with positive results. Recommended returning for continued or worsening symptoms.   Return if symptoms worsen or fail to improve, for STI screening.  Future Appointments  Date Time Provider Department Center  09/10/2023  2:40 PM Sallyanne Kuster, NP [facility] None  Total time spent 10 minutes   Lenice Llamas, [place]

## 2022-11-01 DIAGNOSIS — F419 Anxiety disorder, unspecified: Secondary | ICD-10-CM | POA: Diagnosis not present

## 2022-11-01 DIAGNOSIS — F4389 Other reactions to severe stress: Secondary | ICD-10-CM | POA: Diagnosis not present

## 2023-01-03 DIAGNOSIS — F419 Anxiety disorder, unspecified: Secondary | ICD-10-CM | POA: Diagnosis not present

## 2023-01-03 DIAGNOSIS — F4389 Other reactions to severe stress: Secondary | ICD-10-CM | POA: Diagnosis not present

## 2023-05-10 NOTE — Progress Notes (Signed)
 Christopher [name] MRN: [id number] DOB: [birth year] (age: 33 y.o.)   Initial Visit Referred by:  No ref. provider found PCP: No primary care provider on file. Chief Complaint:  Chief Complaint  Patient presents with  . Left Knee - Pain    Went snowboarding a month ago and landed on lt knee. Had some swelling and iced it. No pain.     Vitals:  Vitals:   05/10/23 1516  BP: 118/76  Pulse: 61    HPI   History of Present Illness: Christopher [name] left knee pain.  This happened about a month ago.  Patient was snowboarding fell directly onto that knee.  He states that he did have to stop snowboarding when it first happened and he went into the [place].  He notes that his knee was very swollen and look at there was a goose egg on his kneecap.  He states it is gone down but occasionally still bother him and swell up slightly.  He did play collegiate soccer as well and states he injured his knee during that time thinks believe he tore his meniscus but since that time which really not bothered him.  He is in clinic today for initial orthopedic consult.  Allergies: Not on File  Past Medical History: No past medical history on file.  Past Surgical History: No past surgical history on file.  Medications:  No current outpatient medications on file prior to visit.   No current facility-administered medications on file prior to visit.    Family History: No family history on file.  Social History:      Physical Exam:   On exam this is a well-nourished well-developed individual in no acute distress.  They are alert and oriented x3.  Mood and affect are age-appropriate.  They are cooperative with the exam.  They ambulate without difficulty or assistive device.  Head is normocephalic atraumatic.  Neck is supple without meningismus.  Cranial nerves II through XII are grossly intact without deficit.  Skin is warm dry and intact without any signs of infection.  Musculoskeletal examination of the left  knee: He has full range of motion and good quad strength.  There is a slight prepatellar bursitis.  Negative anterior posterior negative valgus varus stress test.     Special Investigations- Review of Diagnostic Tests:   Radiological Studies: I have personally reviewed radiographs taken today and my interpretation is as follows: No acute bony abnormalities.  Note specifically no patella fracture.  Assessment:  1. Acute pain of left knee  XR Knee 1-2 Views Left    2. Prepatellar bursitis, left knee        Plan: I discussed with Tiyon Chien the nature of his knee pain.  Recommended ice oral anti-inflammatories and compression.  Overall reassurance offered.  He is okay to snowboard.   Procedure  Procedures   Follow up: No follow-ups on file.  Orders Placed This Encounter  Procedures  . XR Knee 1-2 Views Left

## 2023-09-03 ENCOUNTER — Telehealth: Payer: Self-pay | Admitting: Nurse Practitioner

## 2023-09-03 NOTE — Telephone Encounter (Signed)
 Left vm and sent mychart message to confirm 09/10/23 appointment-Toni

## 2023-09-10 ENCOUNTER — Encounter: Payer: BLUE CROSS/BLUE SHIELD | Admitting: Nurse Practitioner

## 2023-11-28 ENCOUNTER — Ambulatory Visit

## 2023-11-28 DIAGNOSIS — A630 Anogenital (venereal) warts: Secondary | ICD-10-CM

## 2023-11-28 MED ORDER — IMIQUIMOD 5 % EX CREA
TOPICAL_CREAM | CUTANEOUS | 0 refills | Status: AC
Start: 2023-11-28 — End: ?

## 2023-11-28 NOTE — Progress Notes (Signed)
 [facility] Department STI clinic [address] [place] [address] Main phone: [phone]  STI treatment visit  Subjective:  Christopher [name] is a 33 y.o. male being seen today for treatment of genital warts.  Patient has the following medical conditions:  Patient Active Problem List   Diagnosis Date Noted   Generalized papular rash 08/05/2022   Rupture of UCL of left thumb 12/02/2021   Left wrist pain 06/04/2021   Vitamin D  deficiency 01/20/2020   Left shoulder pain 06/24/2019   Polyarthralgia 05/23/2019   Nonallopathic lesion of cervical region 04/22/2019   Nonallopathic lesion of thoracic region 04/22/2019   Nonallopathic lesion of rib cage 04/22/2019   Nonallopathic lesion of lumbosacral region 04/22/2019   Nonallopathic lesion of sacral region 04/22/2019   Atopic dermatitis 07/22/2017   Chief Complaint  Patient presents with   SEXUALLY TRANSMITTED DISEASE   HPI Patient to clinic for cryotherapy. Last here on 10/09/22, tolerated procedure well. Has 2 spots he would like treated today.  See flowsheet for further details and programmatic requirements  Hyperlink available at the top of the signed note in blue.  Flow sheet content below:  Pregnancy Intention Screening Does the patient want to become pregnant in the next year?: Yes Does the patient's partner want to become pregnant in the next year?: Yes Would the patient like to discuss contraceptive options today?: N/A Counseling Patient counseled to use condoms with all sex: Condoms declined RTC in 2-3 weeks for test results: Yes Clinic will call if test results abnormal before test result appt.: Yes Test results given to patient Patient counseled to use condoms with all sex: Condoms declined  Screening for MPX risk: Does the patient have an unexplained rash? No Is the patient MSM? No Does the patient endorse multiple sex partners or anonymous sex partners? No Did the patient  have close or sexual contact with a person diagnosed with MPX? No Has the patient traveled outside the [place]  where MPX is endemic? No Is there a high clinical suspicion for MPX-- evidenced by one of the following No  -Unlikely to be chickenpox  -Lymphadenopathy  -Rash that present in same phase of evolution on any given body part  STI screening history: Last HIV test per patient/review of record was  Lab Results  Component Value Date   HMHIVSCREEN Negative - Validated 06/20/2022   HMHIVSCREEN Negative - Validated 06/20/2022   No results found for: HIV  Last HEPC test per patient/review of record was No results found for: HMHEPCSCREEN No components found for: HEPC   Last HEPB test per patient/review of record was No components found for: HMHEPBSCREEN   Fertility: Does the patient or their partner desires a pregnancy in the next year? No  Immunization History  Administered Date(s) Administered   HPV 9-valent 08/15/2016, 10/18/2016, 02/21/2017   Hepatitis B, ADULT 1990[birth year]04[birth year]14, 01/01/1991, 06/04/1991   MMR 07/03/1992, 05/18/1995   Tdap 08/30/2020    The following portions of the patient's history were reviewed and updated as appropriate: allergies, current medications, past medical history, past social history, past surgical history and problem list.  Objective:  There were no vitals filed for this visit.  Physical Exam Exam conducted with a chaperone present Brett Orange).  Constitutional:      Appearance: Normal appearance.  HENT:     Head: Normocephalic.     Mouth/Throat:     Mouth: Mucous membranes are moist.  Eyes:     General: No scleral icterus.  Right eye: No discharge.        Left eye: No discharge.  Pulmonary:     Effort: Pulmonary effort is normal.  Genitourinary:      Comments: Red dots represent very small, flesh colored warts Skin:    General: Skin is warm and dry.  Neurological:     General: No focal deficit present.     Mental Status: He is  alert.  Psychiatric:        Mood and Affect: Mood normal.        Behavior: Behavior normal.    Assessment and Plan:  Christopher [name] is a 33 y.o. male presenting to the [facility] Department for STI screening  1. Genital warts (Primary)  - imiquimod  (ALDARA ) 5 % cream; Apply topically 3 (three) times a week.  Dispense: 24 each; Refill: 0 - Can try imiquimod  in place of cryotherapy and see if it resolves symptoms at home - Can also return for cryotherapy if cream not completely effective.  PROCEDURE: Cryotherapy  Preoperative diagnosis: genital warts Procedure: Cryotherapy of skin lesions Preprocedure counseling: The risks, benefits, and alternatives of the procedure were discussed with the patient.   EBL: 0 ml Anesthesia: None  Lesions identified: 1. Mons pubis 2. Base of penis on left side   A test freeze was performed ensuring coverage of entire area as above.  The cryotherapy was then applied for  seconds until an ice ball formed with a 5-7 mm border.  This was allowed to thaw and then repeated for a total of 3 cycles.   The patient tolerated the procedure well.  Return precautions provided.  Return to the office in 2-3 weeks for reevaluation.     Recommended condom use with all sex Discussed importance of condom use for STI prevention  Return for cryotherapy.  No future appointments.  Damien FORBES Satchel, NP

## 2023-11-28 NOTE — Progress Notes (Addendum)
 Pt is here today to freeze warts around perineal area. Declines STD testing and Condoms. Wilkie Drought, RN.
